# Patient Record
Sex: Male | Born: 1965 | Race: White | Hispanic: No | Marital: Married | Smoking: Never smoker
Health system: Southern US, Academic
[De-identification: ages and names within clinical notes are randomized; demographics above are authoritative.]

---

## 2000-01-04 ENCOUNTER — Other Ambulatory Visit (HOSPITAL_COMMUNITY): Payer: Self-pay

## 2022-05-07 IMAGING — MR MRI SHOULDER RT W/O CONTRAST
5 of 6 series · 30 of 40 positions shown · IV contrast (gadolinium)
Comparison: None available.

﻿EXAM:  14884   MRI SHOULDER RT W/O CONTRAST
INDICATION: 55-year-old with persistent, severe pain for several months with diminished range of motion.  No specific injury. No history of shoulder surgery in the past.
TECHNIQUE: Multiplanar, multisequential MRI of the right shoulder was performed without gadolinium contrast.

[Series 7: T2 fat-sat · axial · right · 4.0mm · 0.42mm/px · z∈[-16,+90]mm · 7 of 24 slices shown]
[im 1/24]
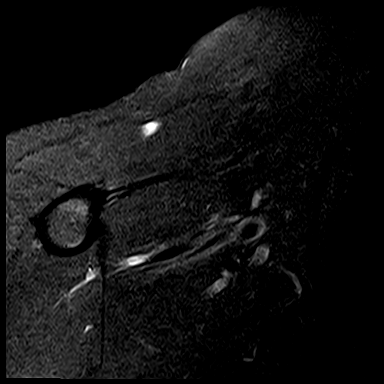
[im 4/24]
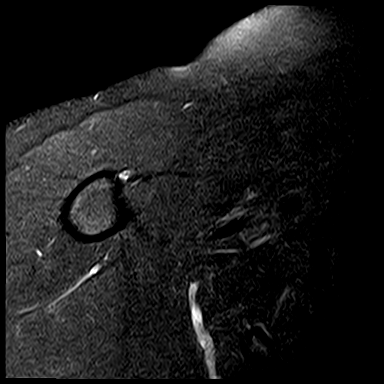
[im 8/24]
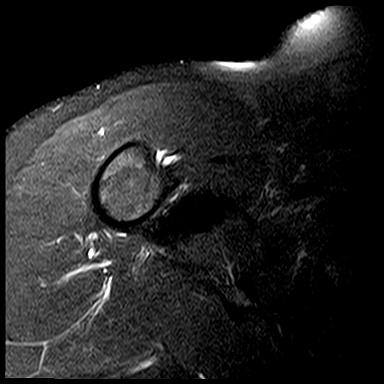
[im 12/24]
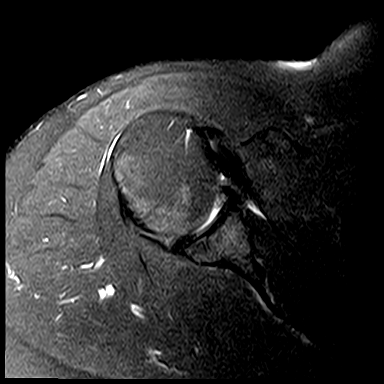
[im 16/24]
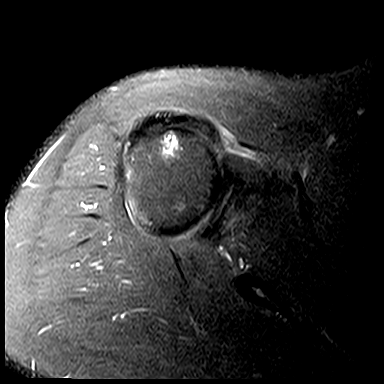
[im 20/24]
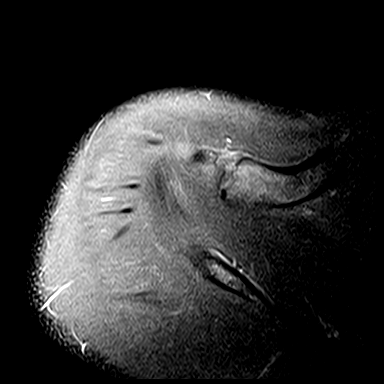
[im 24/24]
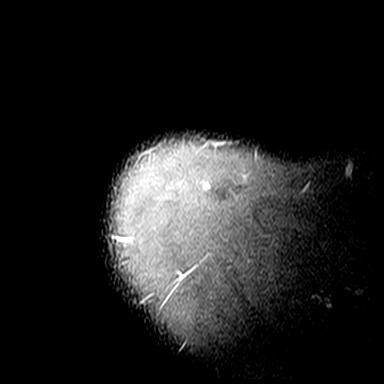

[Series 8: T1 · oblique · right · 4.0mm · 0.31mm/px · 6 of 22 slices shown]
[im 1/22]
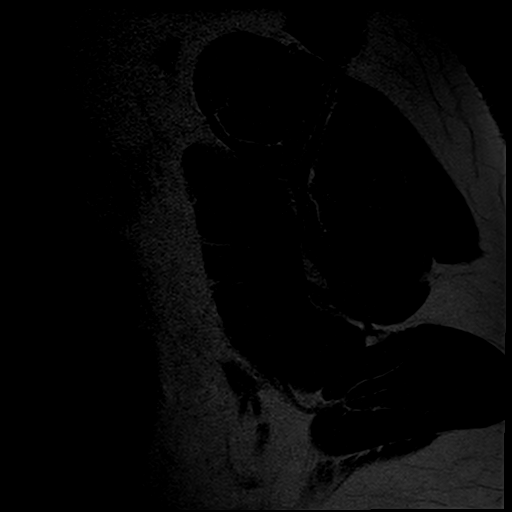
[im 5/22]
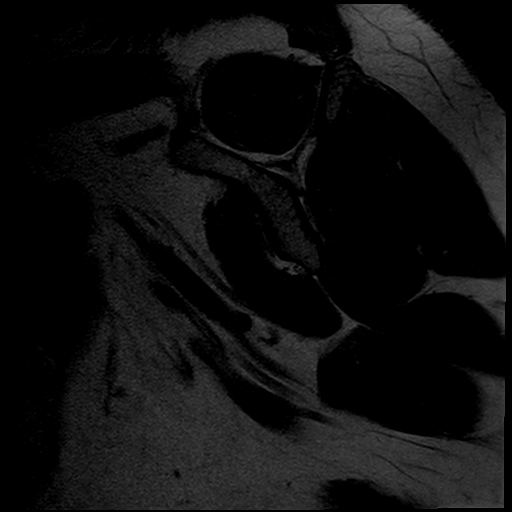
[im 9/22]
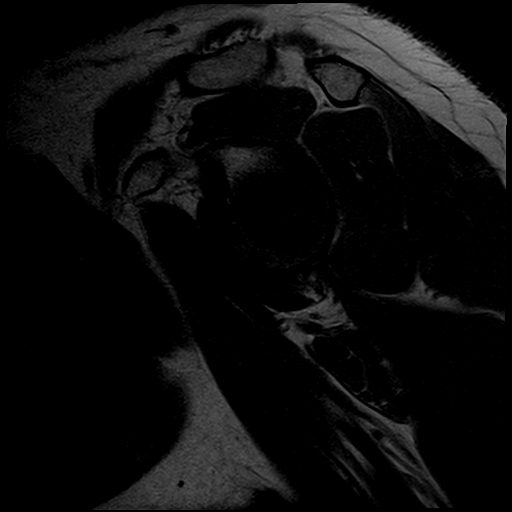
[im 13/22]
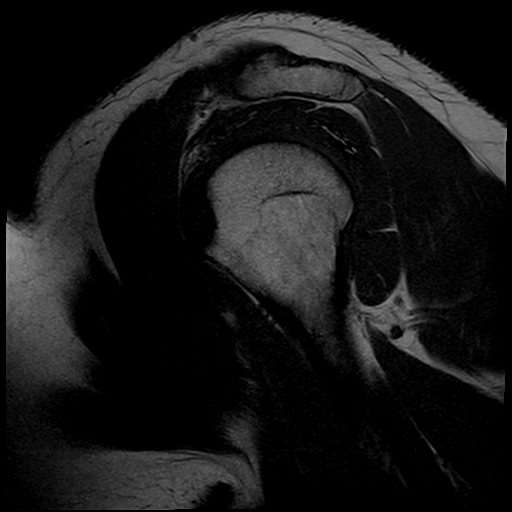
[im 17/22]
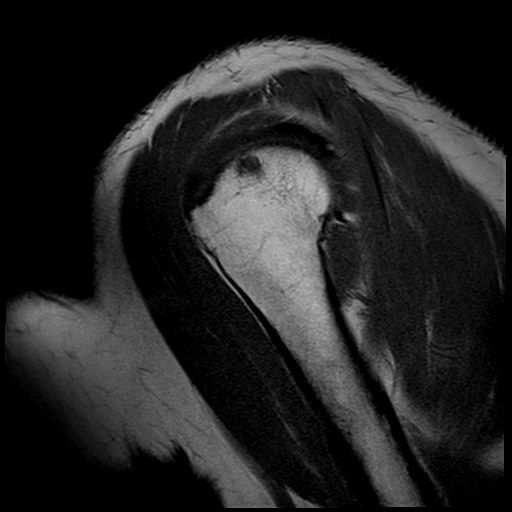
[im 22/22]
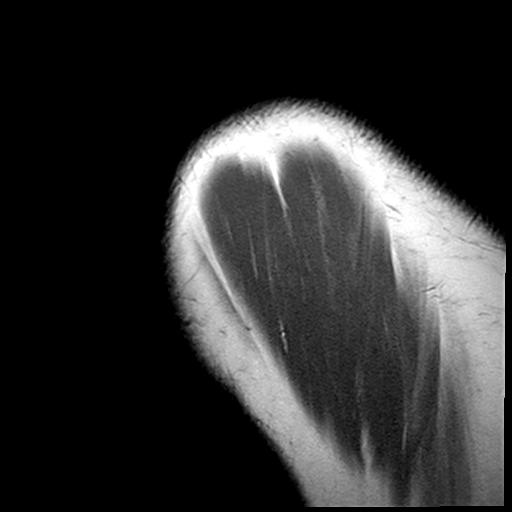

[Series 9: PD fat-sat · axial · right · 4.0mm · 0.36mm/px · z∈[-16,+90]mm · 7 of 24 slices shown (1 of 2)]
[im 1/24]
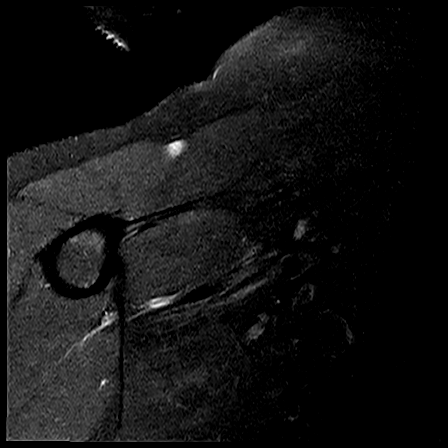
[im 4/24]
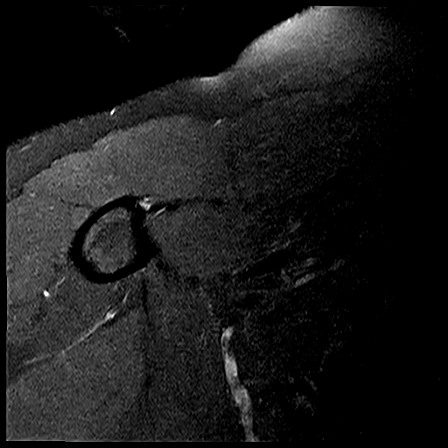
[im 8/24]
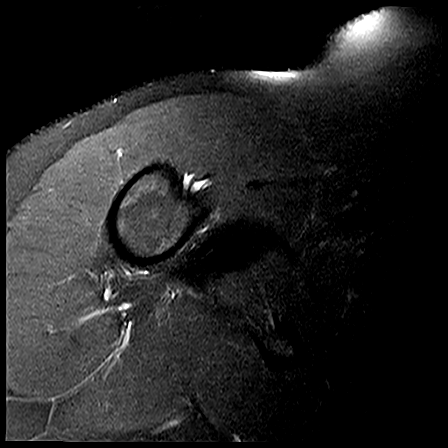
[im 12/24]
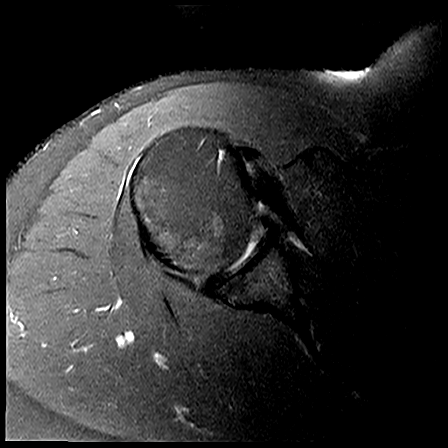
[im 16/24]
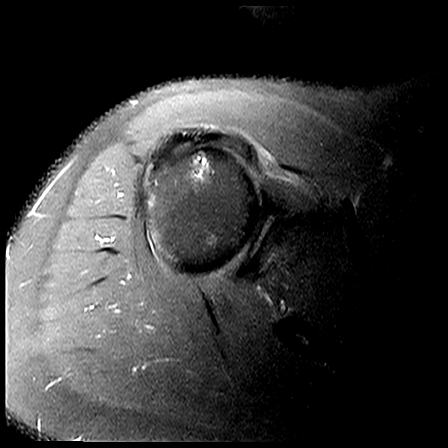
[im 20/24]
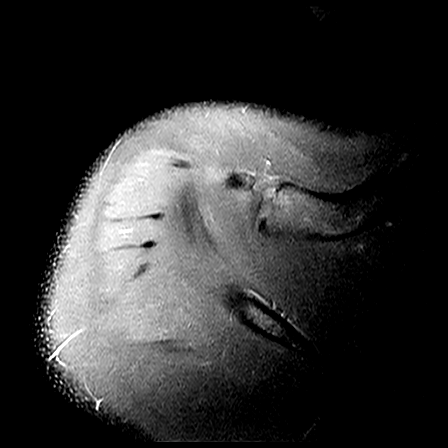
[im 24/24]
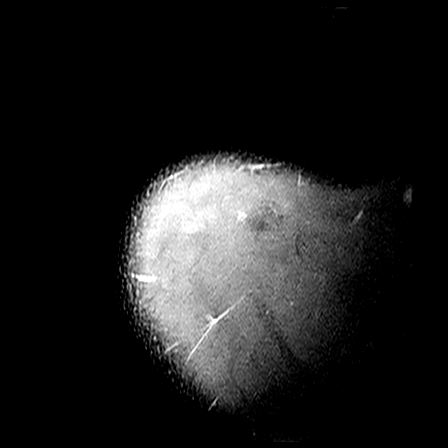

[Series 11: PD fat-sat · oblique · right · 4.0mm · 0.47mm/px · 7 of 23 slices shown (2 of 2)]
[im 1/23]
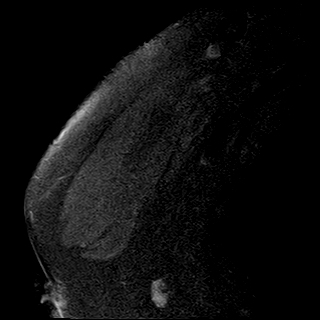
[im 4/23]
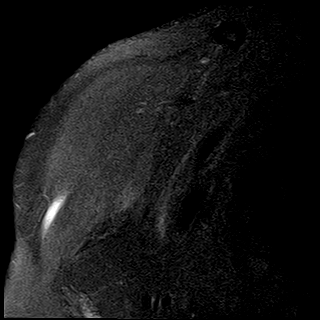
[im 8/23]
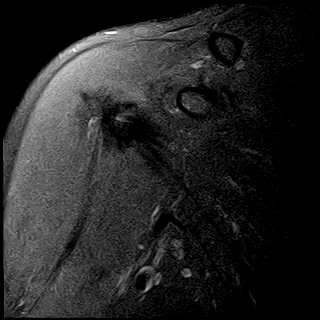
[im 12/23]
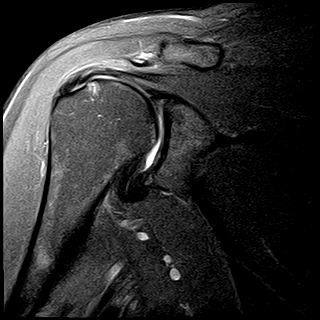
[im 15/23]
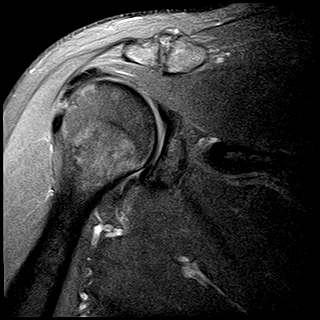
[im 19/23]
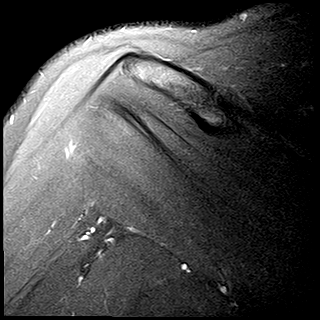
[im 23/23]
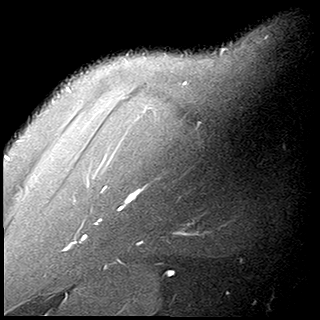

[Series 13: STIR · oblique · right · 4.0mm · 0.47mm/px · 3 of 23 slices shown]
[im 1/23]
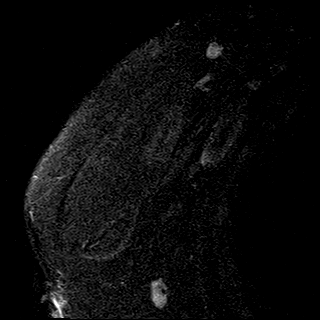
[im 4/23]
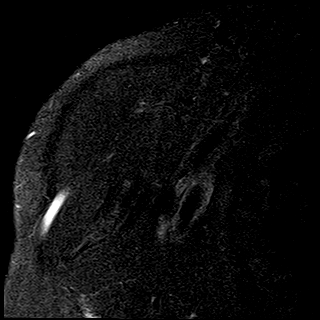
[im 8/23]
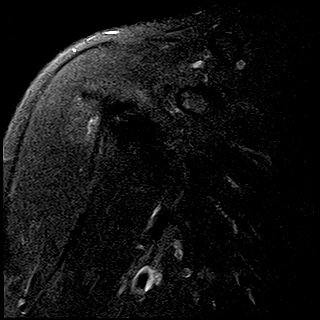

[30 of 40 positions shown; findings below may reference images not displayed]

FINDINGS: No acute bony lesions are seen at the right shoulder.  Degenerative arthritic changes of AC joint are mildly impinging on the subacromion space and the supraspinatus tendon.

Tiny fluid collection in the central portion of supraspinatus tendon suggests tiny intrasubstance partial-thickness tear.  No evidence of full-thickness rotator cuff tear is seen.  A 7 mm sized ganglion cyst is noted in the proximal humerus at the attachment of the supraspinatus tendon.

Mild inflammatory changes of the subscapularis tendon.

Glenoid labrum and long head of biceps tendon are intact.
IMPRESSION: 1. No acute bone changes at the right shoulder. 

2. No evidence of full-thickness rotator cuff tear.  Intrasubstance small partial thickness tear within the supraspinatus tendon. Mild tendinopathy of the subscapularis tendon.

3. Degenerative changes of the AC joint are mildly impinging on the subacromion space and the supraspinatus.  

4. No evidence of labral tear.

## 2022-05-21 ENCOUNTER — Other Ambulatory Visit: Payer: Self-pay

## 2022-06-05 ENCOUNTER — Encounter (HOSPITAL_COMMUNITY): Payer: Self-pay

## 2022-06-05 ENCOUNTER — Ambulatory Visit (HOSPITAL_COMMUNITY): Payer: 59

## 2022-07-02 ENCOUNTER — Other Ambulatory Visit: Payer: Self-pay

## 2022-07-09 ENCOUNTER — Other Ambulatory Visit: Payer: Self-pay

## 2022-07-09 ENCOUNTER — Ambulatory Visit (HOSPITAL_COMMUNITY)
Admission: RE | Admit: 2022-07-09 | Discharge: 2022-07-09 | Disposition: A | Discharge status: Still In Hospital | Payer: 59 | Source: Ambulatory Visit

## 2022-07-09 DIAGNOSIS — M5412 Radiculopathy, cervical region: Secondary | ICD-10-CM | POA: Insufficient documentation

## 2022-07-09 NOTE — PT Evaluation (Signed)
[facility]  Outpatient Physical Therapy  [place], [address]  [phone]  [phone]      Physical Therapy Cervical Evaluation    Date: 07/09/2022  Patient's Name: Jerry Quinn  Date of Birth: July 19, 1966    PT diagnosis/Reason for Referral: cervical pain/stiffnes with C6 RUE radiculopathy, right hand is numb all the time       SUBJECTIVE  Date of onset: 6-8 months gradually got worse over time    Mechanism of injury: NKI     Previous episodes/treatments: none     Medications for this problem: muscle relaxer and anti-inflammatory Ibuprofen as needed regulary 1200 mg per day     Diagnostic tests: none on neck, had MRI on right shoulder negative per ortho     Patient goals:  return feeling in right hand    Occupation:  postmaster    PMH: hypothyroid, right knee surgery meniscus, right middle finger, right foot plantar fascia surgery, left handed     Next MD visit: in Oct     Pain location: runs across post right shoulder, anterior upper chest/sh, some pain in right post elbow, tingling right forearm, Digits 1&2 are numb                     Pain description: DULL and throb, sharp if coughs/sneezes     Pain frequency:  CONTINUOUS and if he doesn't take the ibuprofen    Pain rating: Now 6-7   Best 3   Worst 8     Radiculopathy: yes C6 radicular pain     Pain increases with: SIT and with certain head positions            decreases with : MEDICATION and POSITION CHANGE    Sensation: decreased right radial hand, digits 1&2    Weakness: hand yes     Sleep affected: used to, medication helps, has to reposition frequently     Headaches: no     Dizziness: no     PLOF: independent     Subjective Functional Reports:    Sitting: LIMITED and 30 mins    Standing: WFL    Walking: WFL    Lifting: LIMITED and difficulty with grasp to do the lift     Patient-Specific Functional Score:    Problem Score   1. Fine manipulation at work 5   2. Typing  7   Total:  6           OBJECTIVE    ROM   right left   rotation 75  65   sidebend 40  40    flexion 45  -------------------------   extension 50 pain    -------------------------   AROM Bilateral shoulders WFL all planes      Strength      right left   Shrug (C3-4) 4 4   Shoulder abduction (C5) 5 5   Elbow flexion (C6) 5 5   Elbow extension (C7) 5 5   Wrist extension (C6) 5 5   Wrist flexion (C7) 5 5   Thumb extension (C8) 5 5   Finger abduction (T1) 5 5   Grip Right 90-95#   Left 120#   (norms for age Right 95 and left 100)  Tip pinch right  18.5 # left 19#   (norms for age right 56 and left 19.5)   Lateral pinch right 18#  left 25#   (norms for age right  20 and left 21)    Palpation: increased tone right UT and left C5-T1 paraspinals   Increased Right hand numbness with palpation/jt mobs right or left C6-7 levels     Posture: DECREASED LORDOSIS, FORWARD HEAD, and left shoulder lower. BUE IR, upper c/s ext      Reflexes    Biceps 1+ BUE    Triceps 1+ BUE    Brachioradialis 1+ BUE      Special tests compression negative     Treatment provided:REVIEW OF POC AND GOALS WITH PATIENT, ALL QUESTIONS ANSWERED, PATIENT EDUCATION, and posture education regarding phone/reading/computer work       ASSESSMENT    Impression: patient presents with cervical pain right with right C6 radicular symptoms in nondominant arm with limited AROM left rot and ext, pain with ext, mild fine motor difficulties associated with hand numbness resulting in dropping things a lot. Patient will benefit from PT services to improve ROM/posture, and decrease radicular symptoms    Rehab potential: GOOD    Short Term Goals: 2 Weeks  -Independent home program. ROM / Stabilization / Self care / Activity modification.    -Decrease in pain. Intermittent vs. constant. Worst SPS rating less than 6.   -Increase ROM c/s by 5 degrees bilateral rotation   -Improve function. Reduce radicular symptoms by 50 .   -Other compliant with HEP .      Long Term Goals: 4  Weeks   -Abolish  radicular symptoms. Worst SPS rating less than 2.  -Increase ROM to [facility] to allow normal body mechanics with all mobility.  -Tolerance of static posture not limited by pain.   -Patient specific functional score > 8             PLAN  Patient will attend 2 times per week x 4 weeks. Therapy may include, but is not limited to THERAPEUTIC EXERCISES, MYOFASCIAL/JOINT MOBILIZATION, POSTURE/BODY MECHANICS, HOME INSTRUCTIONS, HEAT/COLD, ULTRASOUND, ELECTRICAL STIMULATION, KINESIOTAPE, and MECHANICAL TRACTION    Plan for next visit MFR, jt mobs, traction, nerve glides, posture education, modalities as needed        Evaluation complexity:   Personal factors impacting POC: FREQUENT OR CHRONIC PAIN and OCCUPATIONAL ADLS (IE HEAVY LIFTING, REPETITIVE TASKS, LONG HOURS)   Co-morbidities impacting POC: PREVIOUS SURGERIES  Complexity of physical exam: INCLUDING MUSCULOSKELETAL SYSTEM (POSTURE, ROM, STRENGTH, HEIGHT/WEIGHT)   Clinical Presentation: STABLE   Evaluation Complexity: LOW-HISTORY 0, EXAMINATION 1-2, STABLE PRESENTATION        Total Session Time 40        Intervention minutes: EVALUATION 40 minutes    Zenia Resides, PT  07/09/2022, 15:03        Start of Service: _________          Certification:    From:______  Through:_________    I certify the need for these services furnished under this plan of treatment and while under my care.    Referring Provider Signature: _______________     Date : _____________________

## 2022-07-12 ENCOUNTER — Other Ambulatory Visit: Payer: Self-pay

## 2022-07-12 ENCOUNTER — Ambulatory Visit (HOSPITAL_COMMUNITY)
Admission: RE | Admit: 2022-07-12 | Discharge: 2022-07-12 | Disposition: A | Discharge status: Still In Hospital | Payer: 59 | Source: Ambulatory Visit

## 2022-07-12 NOTE — PT Treatment (Addendum)
[facility]  Outpatient Physical Therapy  [place], [address]  [phone]  [phone]    Physical Therapy Treatment Note    Date: 07/12/2022  Patient's Name: Jerry Quinn  Date of Birth: 1966-01-31            Visit #/POC:2 of 8  Authorization:  POC Signed?: No  POC Ends:   Next Progress Note Due: 9/19      Evaluating Physical Therapist: Zenia Resides, PT  PT diagnosis/Reason for Referral: cervical pain/stiffnes with C6 RUE radiculopathy, right hand is numb all the time    Next Scheduled Physician Appointment: After PT complete  Allergies/Contraindications:           Subjective: States that he continues to have numbness from his (R) elbow dow to his thumb and index fingers. States the numbness in his forearm comes and goes, however, the thumb and index numbness never goes away. State she does have a hard time griping objects with his (R) hand. States he has no pain in his neck.    Objective:Treatment delivered as follows:      EXERCISE/ACTIVITY NAME REPETITIONS RESISTANCE COMPLETED THIS DOS     MFR cervical paraspinals and UT   y   Joint mobs PA cervical   y   Cervical traction   y   Horizontal abd  x15 Red TB y   Cervical retraction standing x5  y                                 Assessment: Noted modification (minimizing movement with towels)required with cervical retraction d/t radicular symptoms recreated. Noted cervical traction in flexion and neutral recreated radicular symptoms. The radicular symptoms did slightly decrease with minimal pull and in about 10 degrees of cervical flexion.    Short Term Goals: 2 Weeks  -Independent home program. ROM / Stabilization / Self care / Activity modification.                            -Decrease in pain. Intermittent vs. constant. Worst SPS rating less than 6.               -Increase ROM c/s by 5 degrees bilateral rotation   -Improve function. Reduce radicular symptoms by 50 .               -Other  compliant with HEP .        Long Term Goals: 4  Weeks               -Abolish radicular symptoms. Worst SPS rating less than 2.  -Increase ROM to [facility] to allow normal body mechanics with all mobility.  -Tolerance of static posture not limited by pain.               -Patient specific functional score > 8      Plan: Continue following current POC. Assess patient's response to treatment. Inititae nerve glides next visit    Total Session Time 30 and Timed code minutes 30  THERAPEUTIC EXERCISE 10 minutes and JOINT MOBILIZATION/MFR 20 minutes      Carlton Adam, PTA  07/12/2022, 13:08

## 2022-07-15 ENCOUNTER — Other Ambulatory Visit: Payer: Self-pay

## 2022-07-15 ENCOUNTER — Ambulatory Visit (HOSPITAL_COMMUNITY)
Admission: RE | Admit: 2022-07-15 | Discharge: 2022-07-15 | Disposition: A | Discharge status: Still In Hospital | Payer: 59 | Source: Ambulatory Visit

## 2022-07-15 NOTE — PT Treatment (Signed)
[facility]  Outpatient Physical Therapy  [place], [address]  [phone]  [phone]    Physical Therapy Treatment Note    Date: 07/15/2022  Patient's Name: Jerry Quinn  Date of Birth: 28-Jul-1966            Visit #/POC:3 of 8  Authorization:  POC Signed?: No  POC Ends:   Next Progress Note Due: 9/19      Evaluating Physical Therapist: Zenia Resides, PT  PT diagnosis/Reason for Referral: cervical pain/stiffnes with C6 RUE radiculopathy, right hand is numb all the time    Next Scheduled Physician Appointment: After PT complete  Allergies/Contraindications:         Subjective: Reports that his pain today is about a 2/10. States that he did not have to take any ibuprofen over the weekend. Really didn't get sore from Friday's treatment.     Objective:Treatment delivered as follows: Pt was given supine cervical retraction for HEP with illustrated handout.      EXERCISE/ACTIVITY NAME REPETITIONS RESISTANCE COMPLETED THIS DOS     MFR cervical paraspinals and UT   y   Joint mobs PA cervical   y   Cervical traction   y   Horizontal abd  x15 Red TB y   Cervical retraction standing x5  y                                 Assessment: No modifications needed today. He responded favorably to last treatment with decreased use of medications. Reported some soreness after session but increase in radicular symptoms.    Short Term Goals: 2 Weeks  -Independent home program. ROM / Stabilization / Self care / Activity modification.                            -Decrease in pain. Intermittent vs. constant. Worst SPS rating less than 6.               -Increase ROM c/s by 5 degrees bilateral rotation   -Improve function. Reduce radicular symptoms by 50 .               -Other compliant with HEP .        Long Term Goals: 4  Weeks               -Abolish radicular symptoms. Worst SPS rating less than 2.  -Increase ROM to [facility] to allow normal body mechanics with all  mobility.  -Tolerance of static posture not limited by pain.               -Patient specific functional score > 8      Plan: Continue following current POC. Assess patient's response to treatment.     Total Session Time 30 and Timed code minutes 30  THERAPEUTIC EXERCISE 10 minutes and JOINT MOBILIZATION/MFR 20 minutes      Porshia Blizzard, PTA 07/15/2022 17:23

## 2022-07-19 ENCOUNTER — Ambulatory Visit (HOSPITAL_COMMUNITY)
Admission: RE | Admit: 2022-07-19 | Discharge: 2022-07-19 | Disposition: A | Discharge status: Still In Hospital | Payer: 59 | Source: Ambulatory Visit

## 2022-07-19 ENCOUNTER — Other Ambulatory Visit: Payer: Self-pay

## 2022-07-19 NOTE — PT Treatment (Signed)
[facility]  Outpatient Physical Therapy  [place], [address]  [phone]  [phone]    Physical Therapy Treatment Note    Date: 07/19/2022  Patient's Name: Jerry Quinn  Date of Birth: [birth year]         Visit #/POC:4of 8  Authorization:  POC Signed?: No  POC Ends:   Next Progress Note Due: 9/19        Evaluating Physical Therapist: Zenia Resides, PT  PT diagnosis/Reason for Referral: cervical pain/stiffnes with C6 RUE radiculopathy, right hand is numb all the time    Next Scheduled Physician Appointment: After PT complete  Allergies/Contraindications:            Subjective: Reports no pain in his shoulders now. States he can move his neck a little bit better but with less pain.     Objective:Treatment delivered as follows: Pt was given supine cervical retraction for HEP with illustrated handout.        EXERCISE/ACTIVITY NAME REPETITIONS RESISTANCE COMPLETED THIS DOS      MFR cervical paraspinals and UT     y   Joint mobs PA cervical     y   Cervical traction     y   Horizontal abd  x15 Red TB y   Cervical retraction standing x5   y    UBE fwd/backwards alt 1 min 5 min  60 rpm  y                                          Assessment: Decreased radicular symptoms noted. Tolerated addition of UBE. Progressing well towards goals.      Short Term Goals: 2 Weeks  -Independent home program. ROM / Stabilization / Self care / Activity modification.                            -Decrease in pain. Intermittent vs. constant. Worst SPS rating less than 6.               -Increase ROM c/s by 5 degrees bilateral rotation   -Improve function. Reduce radicular symptoms by 50 .               -Other compliant with HEP .        Long Term Goals: 4  Weeks               -Abolish radicular symptoms. Worst SPS rating less than 2.  -Increase ROM to [facility] to allow normal body mechanics with all mobility.  -Tolerance of static posture not limited by pain.               -Patient  specific functional score > 8      Plan: Continue following current POC. Progress as tolerated.       Total Session Time 30 and Timed code minutes 30  THERAPEUTIC EXERCISE 20 minutes and JOINT MOBILIZATION/MFR 10 minutes      Carlton Adam, PTA  07/19/2022, 17:03

## 2022-07-22 ENCOUNTER — Other Ambulatory Visit: Payer: Self-pay

## 2022-07-22 ENCOUNTER — Ambulatory Visit (HOSPITAL_COMMUNITY)
Admission: RE | Admit: 2022-07-22 | Discharge: 2022-07-22 | Disposition: A | Discharge status: Still In Hospital | Payer: 59 | Source: Ambulatory Visit

## 2022-07-22 NOTE — PT Treatment (Signed)
[facility]  Outpatient Physical Therapy  [place], [address]  [phone]  [phone]    Physical Therapy Treatment Note    Date: 07/22/2022  Patient's Name: Jerry Quinn  Date of Birth: 1966/01/08        Visit #/POC:5of 8  Authorization:  POC Signed?: yes   POC Ends: 08/06/22   Next Progress Note Due: 9/19        Evaluating Physical Therapist: Zenia Resides, PT  PT diagnosis/Reason for Referral: cervical pain/stiffness with C6 RUE radiculopathy, right hand is numb all the time    Next Scheduled Physician Appointment: After PT complete  Allergies/Contraindications: none           Patient was 15 mins late d/t traffic     Subjective: pain level 1-2. Less stiff now.  Pain range past wk 1-4. Worse 1st thing in am. Sleeps with wedge d/t snoring and 1 pillow. Sleeps sidelying. No change in hand numbness - occurs continuously     Objective: AROM c/s Rrot 80, Lrot 80 stiff, RSB 40, LSB 40 stiff, flex 50, ext 50 pain CT junction   MFR and therex per flow sheet     EXERCISE/ACTIVITY NAME REPETITIONS RESISTANCE COMPLETED THIS DOS      MFR cervical paraspinals and UT     y   Joint mobs PA cervical     y   Cervical traction     y   Horizontal abd  X15 seated  Green  TB y   Cervical retraction standing x5   y    UBE fwd/backwards alt 1 min 5 min  60 rpm  y   Seated ER     green band  Y                            Assessment: Increased ROM noted and is now symmetrical. Pain at CT junction with end range extension. No change in Right hand/arm numbness. Her has met 2 of his STG's. Cues to maintain cervical retraction with sit/stand  Short Term Goals: 2 Weeks  -Independent home program. ROM / Stabilization / Self care / Activity modification.  Progressing 07/22/22               -Decrease in pain. Intermittent vs. constant. Worst SPS rating less than 6.   MET 07/22/22                -Increase ROM c/s by 5 degrees bilateral rotation   MET 07/22/22   -Improve function.  Reduce radicular symptoms by 50% .               -Other compliant with HEP .  Long Term Goals: 4  Weeks               -Abolish radicular symptoms. Worst SPS rating less than 2.  -Increase ROM to [facility] to allow normal body mechanics with all mobility.  -Tolerance of static posture not limited by pain.               -Patient specific functional score > 8      Plan: continue and progress as tolerated     Total Session Time 35 and Timed code minutes 35  THERAPEUTIC EXERCISE 15 minutes and JOINT MOBILIZATION/MFR 20 minutes      Zenia Resides, PT  07/22/2022, 16:18

## 2022-07-26 ENCOUNTER — Other Ambulatory Visit: Payer: Self-pay

## 2022-07-26 ENCOUNTER — Ambulatory Visit (HOSPITAL_COMMUNITY)
Admission: RE | Admit: 2022-07-26 | Discharge: 2022-07-26 | Disposition: A | Discharge status: Still In Hospital | Payer: 59 | Source: Ambulatory Visit

## 2022-07-26 NOTE — PT Treatment (Signed)
[facility]  Outpatient Physical Therapy  [place], [address]  [phone]  [phone]    Physical Therapy Treatment Note    Date: 07/26/2022  Patient's Name: Jerry Quinn  Date of Birth: 08-28-1966      Visit #/POC:6of 8  Authorization: no auth required  POC Signed?: yes   POC Ends: 08/06/22   Next Progress Note Due: 9/19        Evaluating Physical Therapist: Zenia Resides, PT  PT diagnosis/Reason for Referral: cervical pain/stiffness with C6 RUE radiculopathy, right hand is numb all the time    Next Scheduled Physician Appointment: 10/22  Allergies/Contraindications: none              Subjective: Reports his neck has improved and reports only stiffness in the mornings. States that he has not taken an Ibuprofen about 2 weeks or so.  States no improvement with  the numbness in his (R) thumb and index fingers and reports it  is continuous.     Objective: Treatment delivered as follows:    AROM:  c/s Rrot 80, Lrot 80 stiff, RSB 40, LSB 40 , flex 53, ext 50    EXERCISE/ACTIVITY NAME REPETITIONS RESISTANCE COMPLETED THIS DOS      MFR cervical paraspinals and UT     n   Joint mobs PA cervical     n   Cervical traction     n   Horizontal abd  X15 seated  [place]  TB Y(HEP) 10/6   Cervical retraction standing x5   n    UBE fwd/backwards alt 1 min 5 min  60 rpm  y   Seated ER   x15 seated  green band  Y     Row machine  2x10  50#  y    open book standing  x10    y   Seated thoracic flexion with rotation arms crossed x5  Y(HEP) 10/6   HEP Review/update   y   Standing median nerve glide   y       Access Code: [email or web address]  URL: [email or web address]  Date: 07/26/2022  Prepared by: Carlton Adam    Exercises  - Seated Thoracic Flexion and Rotation with Arms Crossed  - 1 x daily - 7 x weekly - 3 sets - 10 reps  - Seated Shoulder Horizontal Abduction with Resistance  - 1 x daily - 7 x weekly - 3 sets - 10 reps    Assessment: Noted improvemnts in mobility and  strength. No improvement with numbness in 1st and 2nd digit. Tolerated median nerve glide well. Reports when turning (L) open book did have tingling in forearm. Patient was able to verbalize and demonstrate proper technique with HEP.      Short Term Goals: 2 Weeks  -Independent home program. ROM / Stabilization / Self care / Activity modification.  Progressing 07/22/22               -Decrease in pain. Intermittent vs. constant. Worst SPS rating less than 6.   MET 07/22/22                -Increase ROM c/s by 5 degrees bilateral rotation   MET 07/22/22   -Improve function. Reduce radicular symptoms by 50% . NOT MET 10/6               -Other compliant with HEP .  Long Term Goals: 4  Weeks               -  Abolish radicular symptoms. Worst SPS rating less than 2.  -Increase ROM to [facility] to allow normal body mechanics with all mobility.  -Tolerance of static posture not limited by pain.               -Patient specific functional score > 8      Plan: Continue and progress therex as tolerated focusing on strengthening and mobility. 2 visits left on current POC.        Total Session Time 35 and Timed code minutes 35  THERAPEUTIC EXERCISE 35 minutes      Carlton Adam, PTA  07/26/2022, 15:31

## 2022-07-30 ENCOUNTER — Other Ambulatory Visit: Payer: Self-pay

## 2022-07-30 ENCOUNTER — Ambulatory Visit (HOSPITAL_COMMUNITY)
Admission: RE | Admit: 2022-07-30 | Discharge: 2022-07-30 | Disposition: A | Discharge status: Still In Hospital | Payer: 59 | Source: Ambulatory Visit

## 2022-07-30 ENCOUNTER — Other Ambulatory Visit (HOSPITAL_COMMUNITY): Payer: Self-pay | Admitting: Orthopaedic Surgery

## 2022-07-30 DIAGNOSIS — M5412 Radiculopathy, cervical region: Secondary | ICD-10-CM

## 2022-07-30 NOTE — PT Treatment (Signed)
[facility]  Outpatient Physical Therapy  [place], [address]  [phone]  [phone]    Physical Therapy Treatment Note    Date: 07/30/2022  Patient's Name: Jerry Quinn  Date of Birth: Sep 04, 1966      Visit #/POC:7 of 8  Authorization: no auth required  POC Signed?: yes   POC Ends: 08/06/22   Next Progress Note Due: 9/19        Evaluating Physical Therapist: Zenia Resides, PT  PT diagnosis/Reason for Referral: cervical pain/stiffness with C6 RUE radiculopathy, right hand is numb all the time    Next Scheduled Physician Appointment: 10/23  Allergies/Contraindications: none               Subjective: Reports his neck is feeling good. States that he continues to have the numbness in his thumb and index finger.     Objective: Treatment delivered as follows:     AROM:  c/s Rrot 80, Lrot 80 , RSB 40, LSB 40 , flex 53, ext 50      EXERCISE/ACTIVITY NAME REPETITIONS RESISTANCE COMPLETED THIS DOS      MFR cervical paraspinals and UT     n   Joint mobs PA cervical     n   Cervical traction     n   Horizontal abd  X15 seated  Green  TB Y(HEP) 10/6   Cervical retraction standing x5   n    UBE fwd/backwards alt 1 min 5 min  60 rpm  y   Seated ER   x15 seated  green band  Y     Row machine  2x10  50#  y    open book standing  x10    y   Seated thoracic flexion with rotation arms crossed x5   n(HEP) 10/6   HEP Review/update     n   Standing median nerve glide     y   Reassessment of goals   y      Patient-Specific Functional Score:                                                                                               10/10  Problem Score   1. Fine manipulation at work 5                       5   2. Typing  7                       8   Total:  6                       7     Assessment:  Patient has made excellent progress towards goals. Noted the numbness in 1st and 2nd digit tends to be patient's main barrier/limitation. Noted slight improved  patient-specific functional score from 6 to 7.        Short Term Goals: 2 Weeks  -Independent home program. ROM / Stabilization / Self care /  Activity modification.  MET 10/10               -Decrease in pain. Intermittent vs. constant. Worst SPS rating less than 6.   MET 07/22/22                -Increase ROM c/s by 5 degrees bilateral rotation   MET 07/22/22   -Improve function. Reduce radicular symptoms by 50% . NOT MET 10/6               -Other compliant with HEP . MET 10/10  Long Term Goals: 4  Weeks               -Abolish radicular symptoms. Worst SPS rating less than 2. Partially MET 10/10  -Increase ROM to [facility] to allow normal body mechanics with all mobility. MET 10/10  -Tolerance of static posture not limited by pain. MET 10/10               -Patient specific functional score > 8 NOT MET 101/10     Plan: Continue and progress therex as tolerated focusing on strengthening and mobility. D/C next visit. Update HEP.               Total Session Time 30 and Timed code minutes 30  THERAPEUTIC EXERCISE 30 minutes      Carlton Adam, PTA  07/30/2022, 17:13

## 2022-08-01 ENCOUNTER — Ambulatory Visit
Admission: RE | Admit: 2022-08-01 | Discharge: 2022-08-01 | Disposition: A | Discharge status: Still In Hospital | Payer: 59 | Source: Ambulatory Visit | Attending: Orthopaedic Surgery | Admitting: Orthopaedic Surgery

## 2022-08-01 ENCOUNTER — Other Ambulatory Visit: Payer: Self-pay

## 2022-08-01 NOTE — PT Treatment (Signed)
[facility]  Outpatient Physical Therapy  [place], [address]  [phone]  [phone]    Physical Therapy Treatment Note    Date: 08/01/2022  Patient's Name: Jerry Quinn  Date of Birth: Feb 19, 1966    PHYSICAL THERAPY DISCHARGE NOTE  Patient attended 8 sessions of PT for c/s pain/stiffness with C6 RUE radiculopathy from 8/19 to 9/12. Treatment included MFR, jt mobs, therex with HEP. Patient's ROM is WFL, symmetrical with only end range pain with ext. The only symptom he has now is the continued right hand numbness which PT treatment did not make any improvement on. Patient wants an MRI. Patient is being discharged from PT services at this time. All goals were met except for the numbness.         Visit #/POC:8 of 8  Authorization: no auth required  POC Signed?: yes   POC Ends: 08/06/22   Next Progress Note Due: 9/19        Evaluating Physical Therapist: Zenia Resides, PT  PT diagnosis/Reason for Referral: cervical pain/stiffness with C6 RUE radiculopathy, right hand is numb all the time    Next Scheduled Physician Appointment: 10/23  Allergies/Contraindications: none               Subjective: Denies pain and reports has not had to take an Ibuprofen in 2 weeks plus. States that he continues to have the numbness in his thumb and index finger. States that he would like to get a MRI now to find out what is causing the numbness.     Objective: Treatment delivered as follows:     AROM:  c/s Rrot 80, Lrot 80 , RSB 40, LSB 40 , EXT and Flexion WFL pain at end range with extension.     EXERCISE/ACTIVITY NAME REPETITIONS RESISTANCE COMPLETED THIS DOS      MFR cervical paraspinals and UT     n   Joint mobs PA cervical     n   Cervical traction     n   Horizontal abd  X15 seated  Green  TB Y(HEP) 10/6   Cervical retraction standing x5   n    UBE fwd/backwards alt 1 min 5 min  60 rpm  y   Seated ER   x15 seated  green band  n    Row machine  2x10  50#  n     open book standing  x5    y   Seated thoracic flexion with rotation arms crossed x5   n(HEP) 10/6   HEP Review/update     Y   Standing median nerve glide     Y (HEP)   Reassessment of goals     y   Thera band and handouts given to pateint  Access Code: [email or web address]  URL: [email or web address]  Date: 08/01/2022  Prepared by: Carlton Adam    Exercises  - Median Nerve Flossing - Tray  - 1 x daily - 7 x weekly - 3 sets - 10 reps  - Standing Row with Resistance  - 1 x daily - 7 x weekly - 3 sets - 10 reps  - Standing Shoulder Single Arm PNF D2 Flexion with Resistance  - 1 x daily - 7 x weekly - 3 sets - 10 reps  - Standing Thoracic Open Book at [place]  - 1 x daily - 7 x weekly - 3 sets - 10 reps    Patient-Specific Functional Score:  10/10  Problem Score   1. Fine manipulation at work 5                       5   2. Typing  7                       8   Total:  6                       7      Assessment:  Patient has met all STGs and 2 of 4 LTGs.  Improvements in strngth and mobility noted. Numbness in 1st and 2nd digit tends to be patient's main barrier/limitation.        Short Term Goals: 2 Weeks  -Independent home program. ROM / Stabilization / Self care / Activity modification.  MET 10/10               -Decrease in pain. Intermittent vs. constant. Worst SPS rating less than 6.   MET 07/22/22                -Increase ROM c/s by 5 degrees bilateral rotation   MET 07/22/22   -Improve function. Reduce radicular symptoms by 50% . MET 10/12 Continues to have Numbness in(R) thumb and index               -Other compliant with HEP . MET 10/10  Long Term Goals: 4  Weeks               -Abolish radicular symptoms. Worst SPS rating less than 2. Partially Met  10/12 continues to have numbness in (R) thumb and index  -Increase ROM to [facility] to allow normal body mechanics with all mobility. MET 10/10  -Tolerance of static posture not limited by  pain. MET 10/10               -Patient specific functional score > 8 NOT MET 10/12     Plan: See PT note.          Total Session Time 20 and Timed code minutes 20  THERAPEUTIC EXERCISE 20 minutes      Carlton Adam, PTA  08/01/2022, 16:48

## 2022-08-30 ENCOUNTER — Other Ambulatory Visit (HOSPITAL_COMMUNITY): Payer: Self-pay

## 2022-08-30 IMAGING — MR MRI CERVICAL SPINE WITHOUT CONTRAST
4 of 5 series · 23 of 48 positions shown · IV contrast (gadolinium)
Comparison: MRI right shoulder dated 05/07/2022.

﻿EXAM:  [id number]   MRI CERVICAL SPINE WITHOUT CONTRAST
INDICATION: 55-year-old male with persistent neck pain with radicular symptoms to right shoulder and right arm.  Numbness in right hand.  Paresthesia at C6 territory.  No history of malignancy or C-spine surgery.
TECHNIQUE: Multiplanar, multisequential MRI of the C-spine was performed without gadolinium contrast.

[Series 5: T2 · sagittal · 3.0mm · 0.75mm/px · 8 of 13 slices shown (1 of 2)]
[im 1/13]
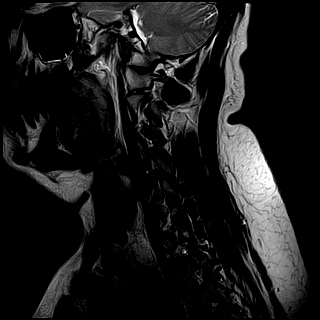
[im 2/13]
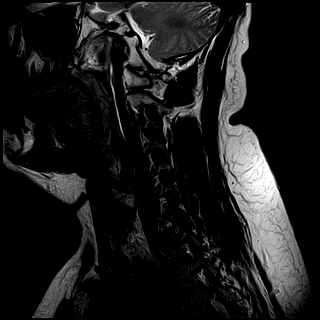
[im 4/13]
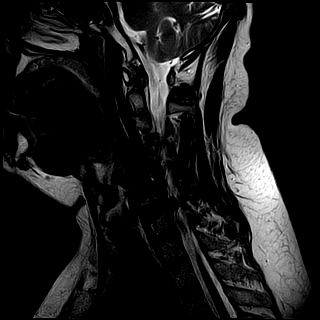
[im 6/13]
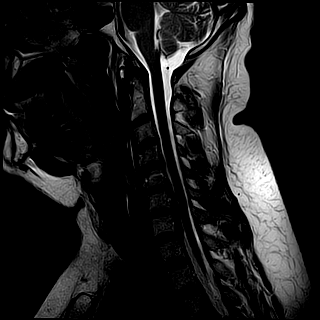
[im 7/13]
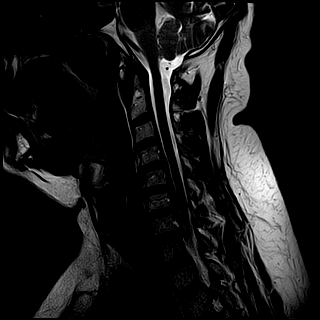
[im 9/13]
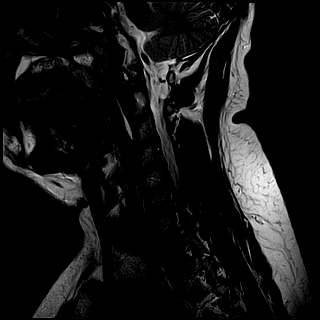
[im 11/13]
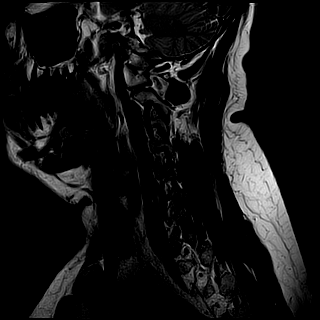
[im 13/13]
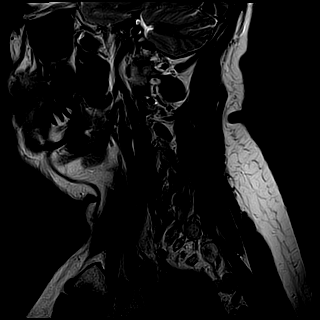

[Series 6: T1 · sagittal · 3.0mm · 0.47mm/px · 3 of 13 slices shown]
[im 2/13]
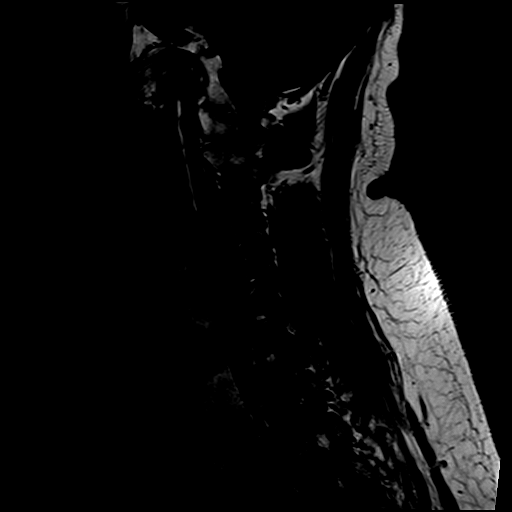
[im 7/13]
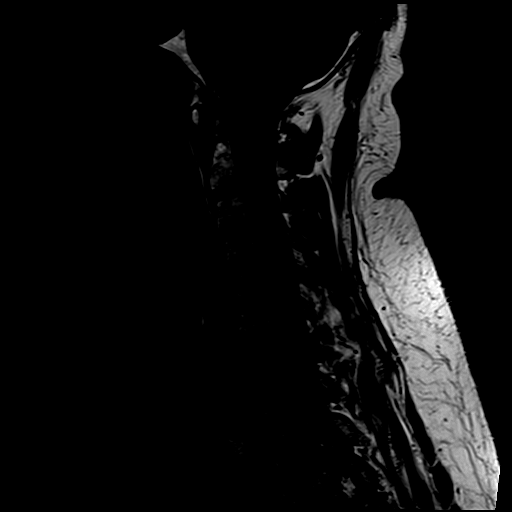
[im 11/13]
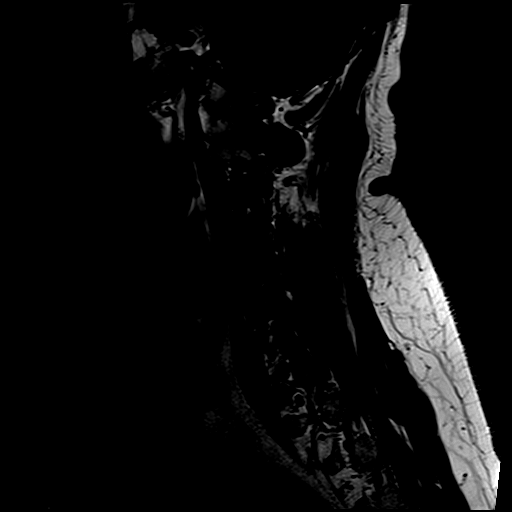

[Series 7: STIR · sagittal · 3.0mm · 0.47mm/px · 3 of 13 slices shown]
[im 2/13]
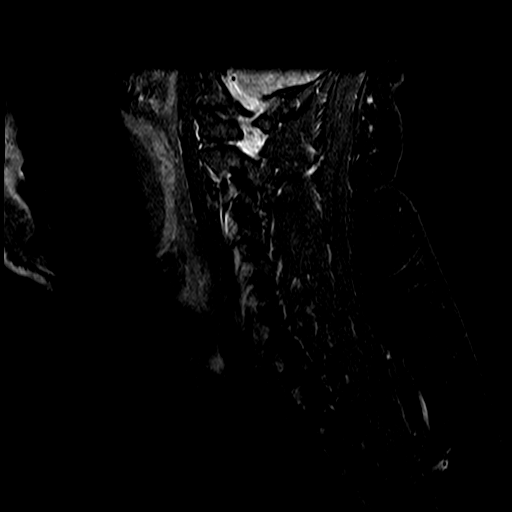
[im 7/13]
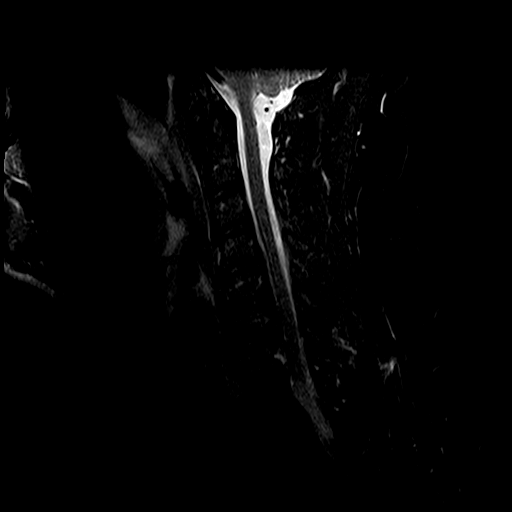
[im 11/13]
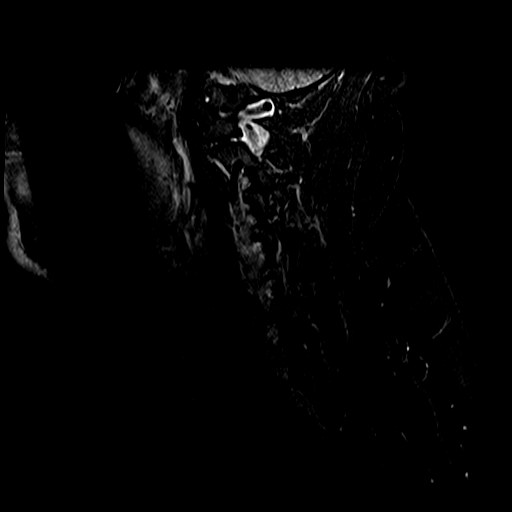

[Series 8: T2 · axial · 3.0mm · 0.39mm/px · z∈[-84,+8]mm · 9 of 18 slices shown (2 of 2)]
[im 1/18]
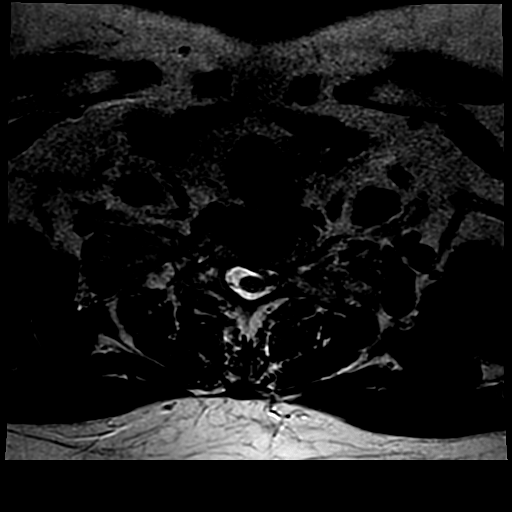
[im 4/18]
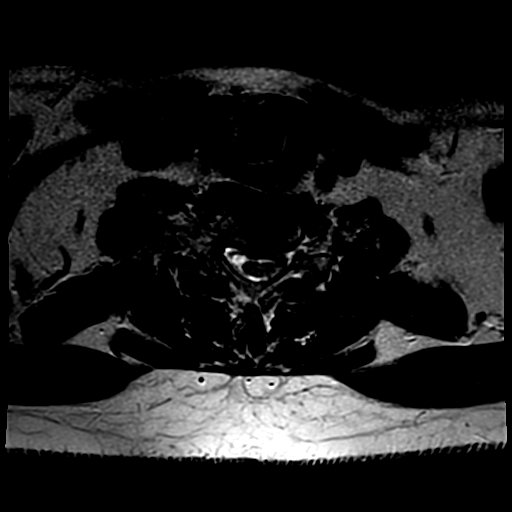
[im 5/18]
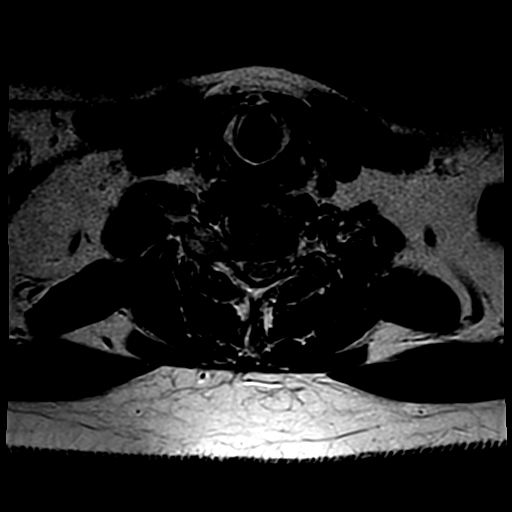
[im 8/18]
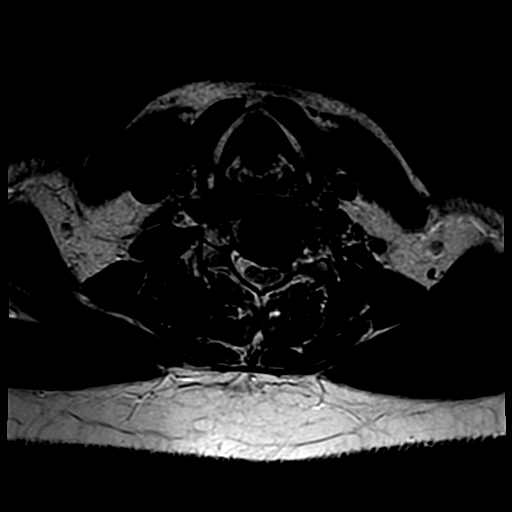
[im 10/18]
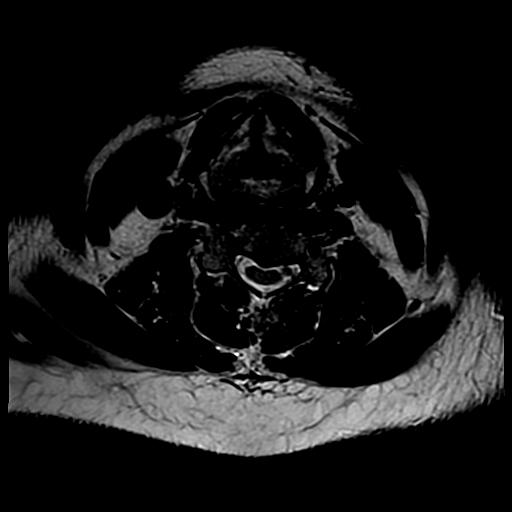
[im 13/18]
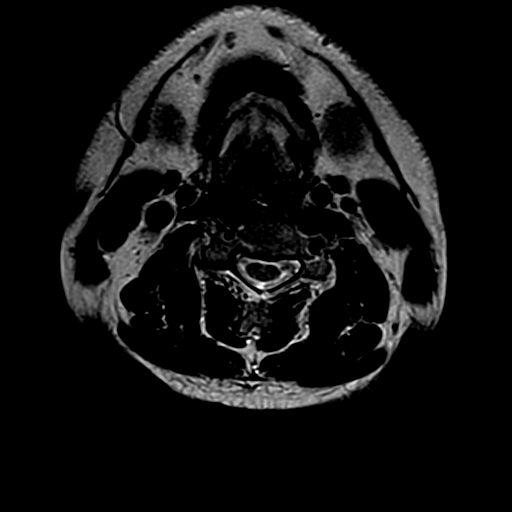
[im 14/18]
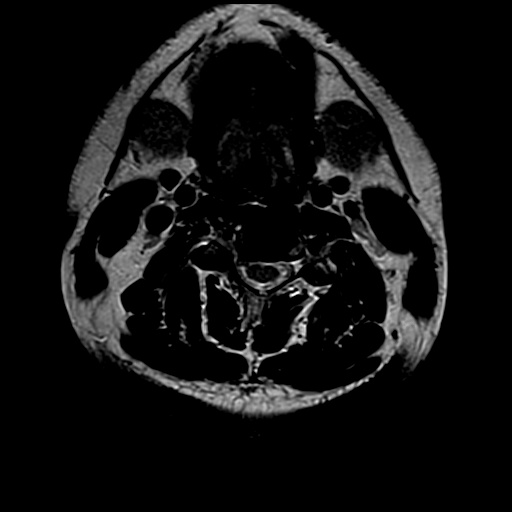
[im 16/18]
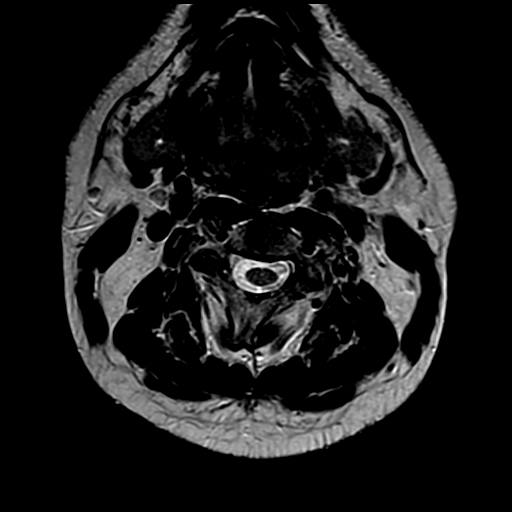
[im 18/18]
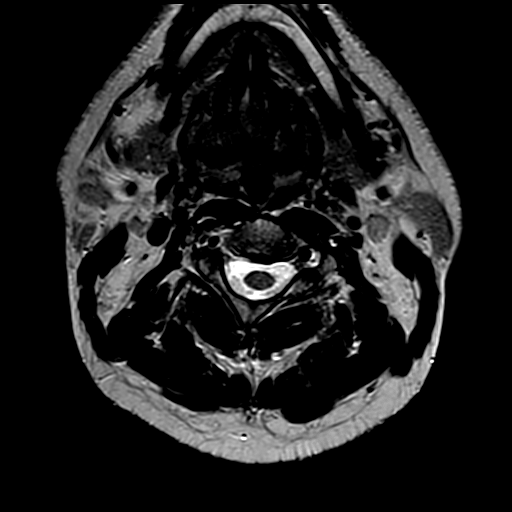

[23 of 48 positions shown; findings below may reference images not displayed]

FINDINGS: No acute bone changes of cervical vertebrae are seen.

No focal abnormalities of foramen magnum structures are seen in the sagittal projection. 

At C2-3 level, no focal disc lesions are seen. 

At C3-4 level, degenerative disc disease with asymmetric bulging annulus to the right causing moderate right foraminal narrowing.

At C4-5 level, small central disc bulge is minimally impinging on thecal sac in the midline.

At C5-6 level, tiny central disc bulge is noted without significant compromise of thecal sac.  Degenerative disc changes are causing mild right foraminal narrowing.

At C6-7 level, significant degenerative changes are seen with a large disc protrusion on the right side, measuring approximately 12 mm in transverse diameter causing severe compromise of the right lateral recess and right neural foramen at C6-7 level impinging on the right C7 nerve root as well as cervical spinal cord on the right side. Compromise of thecal sac is noted with AP diameter in the midline measuring 8.7 mm.

At C7-T1 level, smaller, broad-based disc protrusion on the left side extending from midline to the foraminal level measuring 9.5 mm in transverse diameter impinging on the left lateral recess and left neural foramen at this level.

Cervical spinal cord shows no focal abnormalities.
IMPRESSION: 1. No focal bone changes of cervical vertebrae. 

2. At C6-7 level, significant degenerative changes are seen with a large disc protrusion on the right side, measuring approximately 12 mm in transverse diameter causing severe compromise of the right lateral recess and right neural foramen at C6-7 level impinging on the right C7 nerve root as well as cervical spinal cord on the right side. Compromise of thecal sac is noted with AP diameter in the midline measuring 8.7 mm.

3. At C7-T1 level, smaller, broad-based disc protrusion on the left side extending from midline to the foraminal level measuring 9.5 mm in transverse diameter impinging on the left lateral recess and left neural foramen at this level. 

4. Findings at other disc levels are described above in detail.

5. No focal lesions of the cervical spinal cord.

## 2022-11-19 ENCOUNTER — Ambulatory Visit (INDEPENDENT_AMBULATORY_CARE_PROVIDER_SITE_OTHER): Payer: Self-pay | Admitting: Neurological Surgery

## 2022-12-31 ENCOUNTER — Ambulatory Visit (INDEPENDENT_AMBULATORY_CARE_PROVIDER_SITE_OTHER): Payer: 59 | Admitting: Neurological Surgery

## 2022-12-31 ENCOUNTER — Encounter (INDEPENDENT_AMBULATORY_CARE_PROVIDER_SITE_OTHER): Payer: Self-pay | Admitting: Neurological Surgery

## 2022-12-31 ENCOUNTER — Other Ambulatory Visit: Payer: Self-pay

## 2022-12-31 VITALS — BP 145/108 | HR 87 | Resp 16 | Ht 73.0 in | Wt 265.0 lb

## 2022-12-31 DIAGNOSIS — M5412 Radiculopathy, cervical region: Secondary | ICD-10-CM

## 2022-12-31 NOTE — H&P (Signed)
NEUROSURGERY, [facility]  [place] [address]    History & Physical    Name: Jerry Quinn MRN:  [id number]   Date: 12/31/2022 Age: 57 y.o.       Subjective:     Patient ID:   Jerry Quinn is an 57 y.o. male.      Chief Complaint:      Chief Complaint   Patient presents with    New Patient     New pt, neck pain w/ rt arm radicular symptoms. MRI @ [facility] radiology of the [facility]. PT brought disc.         The patient has a about a 1 year history of right upper extremity pain that initially began as severe pain into the right hand but has since that time decrease in severity and he does have some numbness that goes into his thumb and 1st finger.  He now has this numbness that persists but he has no weakness and he has no other radicular type of symptoms.  The patient does not describe any difficulty with ambulation or gait or any fine motor dysfunction.  The patient did do some physical therapy.  He does have an MRI that was done at [facility] for evaluation of disc herniations noted on that study.  At this time he is relatively asymptomatic in his functioning at a good rate except for some of the numbness in a couple of fingers of his right hand.        History reviewed. No pertinent past medical history.   History reviewed. No pertinent surgical history.   Social History     Tobacco Use    Smoking status: Never    Smokeless tobacco: Never   Substance Use Topics    Alcohol use: Not Currently    Drug use: Not Currently      Family Medical History:    None        Current Outpatient Medications   Medication Sig    levothyroxine (SYNTHROID) 150 mcg Oral Tablet Take 1 Tablet (150 mcg total) by mouth Every morning    pantoprazole (PROTONIX) 40 mg Oral Tablet, Delayed Release (E.C.) Take 1 Tablet (40 mg total) by mouth Once a day       No Known Allergies     Review of Systems:  Review of systems pertinent negatives and postives as discussed in HPI.      Objective:   Neurological Exam  Mental Status  Awake, alert and oriented to person, place and time. Oriented to person, place, time and situation. Recent and remote memory are intact. Speech is normal. Language is fluent with no aphasia. Attention and concentration are normal. Fund of knowledge is appropriate for level of education.    Cranial Nerves  CN I: Sense of smell is normal.  CN II: Visual acuity is normal. Visual fields full to confrontation.  CN III, IV, VI: Extraocular movements intact bilaterally. Normal lids and orbits bilaterally. Pupils equal round and reactive to light bilaterally.  CN V: Facial sensation is normal.  CN VII: Full and symmetric facial movement.  CN VIII: Hearing is normal.  CN IX, X: Palate elevates symmetrically. Normal gag reflex.  CN XI: Shoulder shrug strength is normal.  CN XII: Tongue midline without atrophy or fasciculations.    Motor  Normal muscle bulk throughout. Normal muscle tone. Strength is 5/5 throughout all four extremities.    Sensory  Sensation is intact to light  touch, pinprick, vibration and proprioception in all four extremities.  The patient has some numbness in the right 1st finger and thumb..    Reflexes                                            Right                      Left  Brachioradialis                    2+                         2+  Biceps                                 2+                         2+  Triceps                                2+                         2+  Finger flex                           2+                         2+  Hamstring                            2+                         2+  Patellar                                2+                         2+  Achilles                                2+                         2+    Right pathological reflexes: Hoffmann's absent.  Left pathological reflexes: Hoffmann's absent.    Coordination    Finger-to-nose, rapid alternating movements and heel-to-shin normal bilaterally without  dysmetria.    Gait  Normal casual, toe, heel and tandem gait.      Data reviewed:  Imaging:  I have reviewed the available radiographic studies including images and reports and made an independent interpretation of the findings and compared my findings to that of the interpreting radiologist.    MRI cervical spine done at [facility] [place] shows evidence of a large right C6-7 disc herniation causing severe foraminal compromise.  There was a smaller left-sided  C7-T1 disc herniation causing foraminal compromise at that level.  There was no high-grade stenosis of the central spinal canal.      Labs:     Assessment & Plan:       ICD-10-CM    1. Cervical radiculopathy  M54.12       Today we had a discussion about the patient's symptoms.  He does not have severe radicular symptoms at this time and he does not have evidence of any weakness on clinical examination that would be part of the radicular pattern.  He has a negative Spurling sign.  Secondary to the lack of severe symptoms and only some small numbness of the right thumb and 1st finger I would not recommend any surgical intervention even though there are large disc herniation seen on the MRI study.  He is doing well in his functioning well in his almost at his baseline.  I did educate him on worsening radicular symptoms as well as myelopathic type symptoms and he will call us if he has any worsening of these types his symptoms.  All questions were answered with the patient he agrees with this plan of care and we will see him back on an as-needed basis.    On the day of the encounter, a total of  40 minutes was spent on this patient encounter including review of historical information, examination, documentation and post-visit activities. The time documented excludes procedural time.   Otho Najjar, MD    This note was partially generated using MModal Fluency Direct system, and there may be some incorrect words, spellings, and punctuation that were  not noted in checking the note before saving.

## 2023-06-18 NOTE — Progress Notes (Signed)
Received a medical record request from Peri Jefferson, PA with [facility]  via fax. Faxed last office note as they were the referring providers office.

## 2023-08-18 ENCOUNTER — Other Ambulatory Visit (HOSPITAL_COMMUNITY): Payer: Self-pay | Admitting: PHYSICIAN ASSISTANT

## 2023-08-18 ENCOUNTER — Ambulatory Visit
Admission: RE | Admit: 2023-08-18 | Discharge: 2023-08-18 | Disposition: A | Discharge status: Still In Hospital | Payer: 59 | Source: Ambulatory Visit | Attending: PHYSICIAN ASSISTANT | Admitting: PHYSICIAN ASSISTANT

## 2023-08-18 ENCOUNTER — Other Ambulatory Visit: Payer: Self-pay

## 2023-08-18 DIAGNOSIS — M5010 Cervical disc disorder with radiculopathy, unspecified cervical region: Secondary | ICD-10-CM | POA: Insufficient documentation

## 2023-08-18 DIAGNOSIS — M5090 Cervical disc disorder, unspecified, unspecified cervical region: Secondary | ICD-10-CM | POA: Insufficient documentation

## 2023-08-18 NOTE — PT Evaluation (Signed)
[facility]  Outpatient Physical Therapy  [address]  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 Patient is left handed. Patient states he is noticing profound  weakness in B hands has been dropping things.       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 Prev was playing softball/basketball      Next MD visit: January     Pain location: Centralized neck pain                     Pain description: DULL and soreness, numbness/tingling into the right side     Pain frequency:  INTERMITTENT    Pain Rating:      Current Best Worst   EVAL 08/18/2023 1 0 7                          Radiculopathy: Into the right hand     Pain increases with:  quick head turns in either direction with a painful pop, heavy lifting           decreases with :  None      Sleep affected: Yes, pt uncertain if this is the neck or unrelated issue     Headaches: No    Dizziness: No      Patient-Specific Functional Score:    Problem  Score   1. Turning head  7   2. Bringing head back to wash hair 5   3. Lifting 10 lbs  8   Total score = sum of the activity scores/number of activities    Minimal detectable change (90% CI) for avg score = 2 points    Minimal detectable change (90% CI) for single activity score = 3 points  6.66         OBJECTIVE    ROM   right left   rotation 40, some tightness noted with this  54   sidebend 37 35   flexion 35 -------------------------   extension 30 -------------------------       Strength     right left   Shrug (C3-4) WFL down  WFL here down   Shoulder abduction (C5)     Elbow flexion (C6)     Elbow extension (C7)     Wrist extension (C6)     Wrist flexion (C7)     Thumb extension (C8)  Finger abduction (T1)     Neck lat. flexion [facility] WFL   Neck rotation [facility]   Neck flexion WFL ----------------------------------   Neck extension WLF ----------------------------------     Palpation: Non-tender to palpation throughout c/t spine    Posture: WNL    Grip strength  L hand elbow at 90: 80 lbs  R hand elbow at 90: 60 lbs         Special tests     ULTT: all neg B  Spurlings Test: [facility] B     Treatment provided:REVIEW OF POC AND GOALS WITH PATIENT, ALL QUESTIONS ANSWERED and PATIENT EDUCATION    Access Code: [email or web address]  URL: [email or web address]  Date: 08/18/2023  Prepared by: [name]    Exercises  - Cervical Extension AROM with Strap  - 2 x daily - 7 x weekly - 2 sets - 10 reps  - Supine Chin Tuck  - 2 x daily - 7 x weekly - 2 sets - 10 reps - 3 seconds  hold  - Standing Median Nerve Glide  - 2 x daily - 7 x weekly - 2 sets - 10 reps    ASSESSMENT    Impression: Cha Press is a 57 year old man presenting to outpatient PT secondary to cervical disc disorder. Pt reports recent MRI at outside medical provider indicative of disc herniation at C6-7 and C7-T1. He is a left handed individual who has been experiencing numbness into his right thumb, pointer, and middle finger. He states weakness here as  well, he has been having issues with dropping things. Patient has met with neurosurgery and had a surgery scheduled for this which was denied through insurance. He would benefit from outpatient PT to address to improve ROM, grip strength, and sensation and he may be an appropriate candidate for mechanical traction intervention as well. He has no concomitant comorbidities and his rehabilitation potential is good at this time.    Rehab potential: GOOD    Goals:    Short-Term Goals: (3 Weeks):     1.  Pt to be independent and consistent with HEP reporting exercise performance 1-2x daily to facilitate functional carryover of therapeutic gain to daily activity    2.  Pt to report pain at worst no greater than 4/10 to improve tolerance of object manipulation/handling  3.  Pt to demonstrate cervical extension to 40 deg or greater to improve ability to bring head back as in with hair washing ADLS    Long-Term Goals: (6 Weeks):     1.  Patient to improve PSFS to 8 or greater to reduce participation restriction secondary to this condition  2.  Patient to demonstrate symmetrical grip strength  3 Patient to demonstrate symmetrical and pain free cervical rotation        PLAN  Patient will attend 2 times per week x 6 weeks. Therapy may include, but is not limited to THERAPEUTIC EXERCISES, MYOFASCIAL/JOINT MOBILIZATION, POSTURE/BODY MECHANICS, ERGONOMIC TRAINING, TRANSFER/GAIT TRAINING, HOME INSTRUCTIONS, HEAT/COLD, ULTRASOUND, ELECTRICAL STIMULATION, KINESIOTAPE, and MECHANICAL TRACTION    Plan for next visit: Warm up UBE, review HEP, possibly trial mech. Cervical traction        Evaluation complexity:   Personal factors impacting POC:  None   Co-morbidities impacting POC:  None  Complexity of physical exam: INCLUDING MUSCULOSKELETAL SYSTEM (POSTURE, ROM, STRENGTH, HEIGHT/WEIGHT) and REFERRAL IS FOR A CHRONIC PROBLEM   Clinical Presentation: STABLE   Evaluation Complexity: LOW-HISTORY 0, EXAMINATION 1-2, STABLE  PRESENTATION  Total Session Time 45, Timed code minutes 0, and Untimed code minutes 45        Intervention minutes: EVALUATION 45 minutes    [name], PT  08/18/2023, 08:08                Certification:    From:______  Through:_________    I certify the need for these services furnished under this plan of treatment and while under my care.    Referring Provider Signature: _______________     Date : _____________________

## 2023-08-22 ENCOUNTER — Ambulatory Visit (HOSPITAL_COMMUNITY): Payer: Self-pay

## 2023-08-25 ENCOUNTER — Other Ambulatory Visit: Payer: Self-pay

## 2023-08-25 ENCOUNTER — Ambulatory Visit (HOSPITAL_COMMUNITY)
Admission: RE | Admit: 2023-08-25 | Discharge: 2023-08-25 | Disposition: A | Discharge status: Still In Hospital | Payer: 59 | Source: Ambulatory Visit | Attending: PHYSICIAN ASSISTANT | Admitting: PHYSICIAN ASSISTANT

## 2023-08-25 DIAGNOSIS — M5010 Cervical disc disorder with radiculopathy, unspecified cervical region: Secondary | ICD-10-CM | POA: Insufficient documentation

## 2023-08-25 NOTE — PT Treatment (Signed)
[facility]  Outpatient Physical Therapy  [address]  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 Patient reports still numbness into R hand     Objective: Therapy session performed per flowsheet, below    Measured ROM: NT today  EXERCISE/ACTIVITY NAME REPETITIONS RESISTANCE COMPLETED THIS DOS   UBE-FW only    4 min  Y   Cervical extension over towel   15  Y   Median nerve glide   20 ea  Y   Corner pec str   2x30 sec  Y   Open book str   15 ea   Y   Supine chin tuck   20  Y   Mechanical cervical traction   15 min 12 lbs static Y         - Cervical Extension AROM with Strap   - Supine Chin Tuck - 2 x daily -  - Standing Median Nerve Glide               Assessment: Pt states no effect traction on radicular sx some "increased blood flow", may benefit from increased weight/resistance next trial    Plan: Continue per POC    Total Session Time 40, Timed code minutes 40, and Untimed code minutes 0  THERAPEUTIC EXERCISE 25 minutes and MECHANICAL TRACTION 15 minutes      [name], PT  08/25/2023, 16:22

## 2023-08-29 ENCOUNTER — Ambulatory Visit (HOSPITAL_COMMUNITY)
Admission: RE | Admit: 2023-08-29 | Discharge: 2023-08-29 | Disposition: A | Discharge status: Still In Hospital | Payer: 59 | Source: Ambulatory Visit | Attending: PHYSICIAN ASSISTANT | Admitting: PHYSICIAN ASSISTANT

## 2023-08-29 NOTE — PT Treatment (Signed)
[facility] Medicine [facility]  Outpatient Physical Therapy  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 States he did have decrease tightness following the traction, but the numbness in his (R) hand has not changed.       Objective: Therapy session performed per flowsheet, below     Measured ROM: NT today  EXERCISE/ACTIVITY NAME REPETITIONS RESISTANCE COMPLETED THIS DOS   UBE-FW only     5 min   Y   Cervical extension over towel    15   Y   Median nerve glide    20 ea   Y   Corner pec str    2x30 sec   Y   Open book str    15 ea   Y   Supine chin tuck    20   Y   Mechanical cervical traction    15 min 14 lbs static Y             - Cervical Extension AROM with Strap   - Supine Chin Tuck - 2 x daily -  - Standing Median Nerve Glide                      Assessment: Tolerated increase from 12 pounds to 14 pounds with mechanical traction with good response. He reports his mobility improved especially with cervical extension.       Short-Term Goals: (3 Weeks):     1.  Pt to be independent and consistent with HEP reporting exercise performance 1-2x daily to facilitate functional carryover of therapeutic gain to daily activity    2.  Pt to report pain at worst no greater than 4/10 to improve tolerance of object manipulation/handling  3.  Pt to demonstrate cervical extension to 40 deg or greater to improve  ability to bring head back as in with hair washing ADLS    Long-Term Goals: (6 Weeks):     1.  Patient to improve PSFS to 8 or greater to reduce participation restriction secondary to this condition  2.  Patient to demonstrate symmetrical grip strength  3 Patient to demonstrate symmetrical and pain free cervical rotation      Plan: Continue per POC       Total Session Time 38, Timed code minutes 23, and Untimed code minutes 15  THERAPEUTIC EXERCISE 23 minutes and MECHANICAL TRACTION 15 minutes      [name], PTA  08/29/2023, 16:21

## 2023-09-01 ENCOUNTER — Other Ambulatory Visit: Payer: Self-pay

## 2023-09-01 ENCOUNTER — Ambulatory Visit (HOSPITAL_COMMUNITY)
Admission: RE | Admit: 2023-09-01 | Discharge: 2023-09-01 | Disposition: A | Discharge status: Still In Hospital | Payer: 59 | Source: Ambulatory Visit | Attending: PHYSICIAN ASSISTANT | Admitting: PHYSICIAN ASSISTANT

## 2023-09-01 NOTE — PT Treatment (Signed)
[facility] Medicine [facility]  Outpatient Physical Therapy  [address]  [place], [address]  [phone]  (Fax) [phone]    Physical Therapy Treatment Note    Date: 09/01/2023  Patient's Name: Jerry Quinn  Date of Birth: 08/14/1966  Physical Therapy Visit      Visit #/POC:4/12  Authorization:60 per cal year   POC Signed?: No  POC Ends: 12/9  Order Ends: 12/9  Next Progress Note Due: 11/25        Evaluating Physical Therapist: [name], DPT   PT diagnosis/Reason for Referral: Cervical Disc Disorder M50.1  Next Scheduled Physician Appointment: Neurosurg appointment in January for presurgical consult   Allergies/Contraindications: No              Subjective: No change in R hand numbness, has been consistent with exercises. Some soreness in back with increase in traction      Objective: Therapy session performed per flowsheet, below     Measured ROM: NT today  EXERCISE/ACTIVITY NAME REPETITIONS RESISTANCE COMPLETED THIS DOS   UBE-FW,BW    2/2   Y   Cervical extension over towel    15   Y   Median nerve glide    20 ea   Y   Corner pec str    2x30 sec   N   Open book str    15 ea    Y   Supine chin tuck    20   Y   Mechanical cervical traction    15 min 16 lbs static Y             - Cervical Extension AROM with Strap   - Supine Chin Tuck - 2 x daily -  - Standing Median Nerve Glide                      Assessment: Patient tolerated increase in traction resistance well today, he continues      Short-Term Goals: (3 Weeks):     1.  Pt to be independent and consistent with HEP reporting exercise performance 1-2x daily to facilitate functional carryover of therapeutic gain to daily activity    2.  Pt to report pain at worst no greater than 4/10 to improve tolerance of object manipulation/handling  3.  Pt to demonstrate cervical extension to 40 deg or greater to improve ability to bring head back as in with hair washing ADLS    Long-Term Goals: (6 Weeks):     1.  Patient to improve  PSFS to 8 or greater to reduce participation restriction secondary to this condition  2.  Patient to demonstrate symmetrical grip strength  3 Patient to demonstrate symmetrical and pain free cervical rotation          Plan: Continue per POC, continue w mechanical traction     Total Session Time 33, Timed code minutes 33, and Untimed code minutes 0  THERAPEUTIC EXERCISE 18 minutes and MECHANICAL TRACTION 15 minutes      [name], PT  09/01/2023, 07:40

## 2023-09-05 ENCOUNTER — Ambulatory Visit (HOSPITAL_COMMUNITY)
Admission: RE | Admit: 2023-09-05 | Discharge: 2023-09-05 | Disposition: A | Discharge status: Still In Hospital | Payer: 59 | Source: Ambulatory Visit | Attending: PHYSICIAN ASSISTANT | Admitting: PHYSICIAN ASSISTANT

## 2023-09-05 NOTE — PT Treatment (Signed)
[facility] Medicine [facility]  Outpatient Physical Therapy  [address]  [place], [address]  [phone]  (Fax) [phone]    Physical Therapy Treatment Note    Date: 09/05/2023  Patient's Name: Jerry Quinn  Date of Birth: [birth year]  Physical Therapy Visit      Visit #/POC:4/12  Authorization:60 per cal year   POC Signed?: No  POC Ends: 12/9  Order Ends: 12/9  Next Progress Note Due: 11/25        Evaluating Physical Therapist: [name], DPT   PT diagnosis/Reason for Referral: Cervical Disc Disorder M50.1  Next Scheduled Physician Appointment: Neurosurg appointment in January for presurgical consult   Allergies/Contraindications: No              Subjective: Numbness in middle finger (R) hand has increased and moved into the pad. States that overall he doesn't feel the traction has improved any symptoms in his hands.      Objective: Therapy session performed per flowsheet, below     Measured ROM: NT today  EXERCISE/ACTIVITY NAME REPETITIONS RESISTANCE COMPLETED THIS DOS   UBE-FW,BW    5 min alt fwd/backwards   Y   Cervical extension over towel    15   n   Median nerve glide    20 ea   Y   Corner pec str    2x30 sec   y   Open book str    15 ea    Y   Supine chin tuck    20   Y   Mechanical cervical traction    15 min 16 lbs static n    horizontal abd seated   Diagonals seated   x12 each  RTB  y   Wall snow  angel x10  y   Rows  x20  y   - Cervical Extension AROM with Strap   - Supine Chin Tuck - 2 x daily -  - Standing Median Nerve Glide                      Assessment: Patient reports no change in radicular symptoms with mechanical traction. He does report increase pain in 3rd digit. Tolerated progression of ther ex with no report of increase pain and numbness/tingling.     Short-Term Goals: (3 Weeks):     1.  Pt to be independent and consistent with HEP reporting exercise performance 1-2x daily to facilitate functional carryover of therapeutic gain to daily activity    2.   Pt to report pain at worst no greater than 4/10 to improve tolerance of object manipulation/handling  3.  Pt to demonstrate cervical extension to 40 deg or greater to improve ability to bring head back as in with hair washing ADLS    Long-Term Goals: (6 Weeks):     1.  Patient to improve PSFS to 8 or greater to reduce participation restriction secondary to this condition  2.  Patient to demonstrate symmetrical grip strength  3 Patient to demonstrate symmetrical and pain free cervical rotation           Plan: Continue per POC, continue w mechanical traction if applicable.           Total Session Time 30 and Timed code minutes 30  THERAPEUTIC EXERCISE 30 minutes      [name], PTA  09/05/2023, 17:02

## 2023-09-08 ENCOUNTER — Other Ambulatory Visit: Payer: Self-pay

## 2023-09-08 ENCOUNTER — Ambulatory Visit (HOSPITAL_COMMUNITY)
Admission: RE | Admit: 2023-09-08 | Discharge: 2023-09-08 | Disposition: A | Discharge status: Still In Hospital | Payer: 59 | Source: Ambulatory Visit | Attending: PHYSICIAN ASSISTANT | Admitting: PHYSICIAN ASSISTANT

## 2023-09-08 NOTE — PT Treatment (Signed)
[facility] Medicine [facility]  Outpatient Physical Therapy  [address]  [place], [address]  [phone]  (Fax) [phone]    Physical Therapy Treatment Note    Date: 09/08/2023  Patient's Name: Jerry Quinn  Date of Birth: [birth year]  Physical Therapy Visit            Visit #/POC: 5/12  Authorization:60 per cal year   POC Signed?: No  POC Ends: 12/9  Order Ends: 12/9  Next Progress Note Due: 11/25        Evaluating Physical Therapist: [name], DPT transferring care to Doy Mince PT, DPT 09/08/23  PT diagnosis/Reason for Referral: Cervical Disc Disorder M50.1  Next Scheduled Physician Appointment: Neurosurg appointment in January for presurgical consult   Allergies/Contraindications: No              Subjective: Patient notes no change to numbness in right hand. Patient notes no difference in symptoms with holding traction.      Objective: Therapy session performed per flowsheet, below     Measured ROM: NT today  EXERCISE/ACTIVITY NAME REPETITIONS RESISTANCE COMPLETED THIS DOS   UBE-FW,BW    5 min alt fwd/backwards   Y   Cervical extension over towel    15   n   Median nerve glide    20xea   Y   Corner pec str    2x30 sec   Y   Open book str    15 ea    Y   Supine chin tuck    2x10   Y   Mechanical cervical traction    15 min 16 lbs static n    horizontal abd seated   Diagonals seated  2x10   2x10  Red   Red  Y   Wall snow  angel 2x10   Y   Rows  2x10  Green Y   - Cervical Extension AROM with Strap   - Supine Chin Tuck - 2 x daily -  - Standing Median Nerve Glide                      Assessment: Patient demos good tolerance to exercise program this date. Mechanical traction d/c due to no relief of symptoms. Patient denies increased N/T or pain throughout exercise program. Will continue to monitor patient response and progress as able and tolerated.      Short-Term Goals: (3 Weeks):     1.  Pt to be independent and consistent with HEP reporting exercise performance 1-2x daily  to facilitate functional carryover of therapeutic gain to daily activity    2.  Pt to report pain at worst no greater than 4/10 to improve tolerance of object manipulation/handling  3.  Pt to demonstrate cervical extension to 40 deg or greater to improve ability to bring head back as in with hair washing ADLS    Long-Term Goals: (6 Weeks):     1.  Patient to improve PSFS to 8 or greater to reduce participation restriction secondary to this condition  2.  Patient to demonstrate symmetrical grip strength  3 Patient to demonstrate symmetrical and pain free cervical rotation           Plan: Continue per POC, continue w mechanical traction if applicable.  Total Session Time 40 and Timed code minutes 40  THERAPEUTIC EXERCISE 40 minutes      Doy Mince, PT  09/08/2023, 17:02

## 2023-09-12 ENCOUNTER — Ambulatory Visit (HOSPITAL_COMMUNITY)
Admission: RE | Admit: 2023-09-12 | Discharge: 2023-09-12 | Disposition: A | Discharge status: Still In Hospital | Payer: 59 | Source: Ambulatory Visit | Attending: PHYSICIAN ASSISTANT | Admitting: PHYSICIAN ASSISTANT

## 2023-09-12 NOTE — PT Treatment (Signed)
[facility] Medicine [facility]  Outpatient Physical Therapy  [address]  [place], [address]  [phone]  (Fax) [phone]    Physical Therapy Treatment Note    Date: 09/12/2023  Patient's Name: Jerry Quinn  Date of Birth: Oct 01, 1966  Physical Therapy Visit      Visit #/POC: 7/12  Authorization:60 per cal year   POC Signed?: No  POC Ends: 12/9  Order Ends: 12/9  Next Progress Note Due: 11/25        Evaluating Physical Therapist: [name], DPT transferring care to Doy Mince PT, DPT 09/08/23  PT diagnosis/Reason for Referral: Cervical Disc Disorder M50.1  Next Scheduled Physician Appointment: Neurosurg appointment in January for presurgical consult   Allergies/Contraindications: No            Subjective:  Pt reports no changes.  States numbness remains the same.  States he is doing exercises at home.  Notes no changes.      Objective:  Warm up on UBE followed by activities as noted below.      Measured ROM:     EXERCISE/ACTIVITY NAME REPETITIONS RESISTANCE COMPLETED THIS DOS   UBE-FW,BW    6 min alt fwd/backwards   Y   Cervical extension over towel    15   n   Median nerve glide    20xea   Y   Corner pec str    2x30 sec   Y   Open book str    15 ea    Y   Supine chin tuck    2x10   Y   Mechanical cervical traction    15 min 16 lbs static n    horizontal abd seated   Diagonals seated  2x10   2x10  Red   Red  Y   Wall snow  angel 2x10   Y   Rows   Low row 2x10  2 x 10  Green  green Y  y   - Cervical Extension AROM with Strap   - Supine Chin Tuck - 2 x daily -  - Standing Median Nerve Glide                  Assessment: Pt tolerates strengthening well.  He reports no improvement in symptoms or mobility but no complaints with strengthening     Short-Term Goals: (3 Weeks):     1.  Pt to be independent and consistent with HEP reporting exercise performance 1-2x daily to facilitate functional carryover of therapeutic gain to daily activity    2.  Pt to report pain at worst no  greater than 4/10 to improve tolerance of object manipulation/handling  3.  Pt to demonstrate cervical extension to 40 deg or greater to improve ability to bring head back as in with hair washing ADLS    Long-Term Goals: (6 Weeks):     1.  Patient to improve PSFS to 8 or greater to reduce participation restriction secondary to this condition  2.  Patient to demonstrate symmetrical grip strength  3 Patient to demonstrate symmetrical and pain free cervical rotation           Plan:  Will continue and progress as tolerated     Total Session Time 30 and Timed code minutes 30  THERAPEUTIC EXERCISE 30 minutes      Brinsley Wence, PTA  09/12/2023, 17:03

## 2023-09-15 ENCOUNTER — Other Ambulatory Visit: Payer: Self-pay

## 2023-09-15 ENCOUNTER — Ambulatory Visit (HOSPITAL_COMMUNITY)
Admission: RE | Admit: 2023-09-15 | Discharge: 2023-09-15 | Disposition: A | Discharge status: Still In Hospital | Payer: 59 | Source: Ambulatory Visit | Attending: PHYSICIAN ASSISTANT | Admitting: PHYSICIAN ASSISTANT

## 2023-09-15 NOTE — PT Treatment (Signed)
[facility] Medicine [facility]  Outpatient Physical Therapy  [address]  [place], [address]  [phone]  (Fax) [phone]    Physical Therapy Progress Note    Date: 09/15/2023  Patient's Name: Jerry Quinn  Date of Birth: June 02, 1966  Physical Therapy Progress Note           isit #/POC: 8/12  Authorization:60 per cal year   POC Signed?: No  POC Ends: 12/9  Order Ends: 12/9  Next Progress Note Due: On or before 09/29/23        Evaluating Physical Therapist: [name], DPT transferring care to Doy Mince PT, DPT 09/08/23  PT diagnosis/Reason for Referral: Cervical Disc Disorder M50.1  Next Scheduled Physician Appointment: Neurosurg appointment in January for presurgical consult   Allergies/Contraindications: No                 Subjective:  Patient reports no change to numbness in his hand at this time.     Objective:  Warm up on UBE followed by activities as noted below.       Measured ROM:       Pain Rating:        Current Best Worst   EVAL 08/18/2023 1 0 7    09/15/23  0  0  8                             Patient-Specific Functional Score:     Problem Score IE 08/18/23 09/15/23   1. Turning head  7 8   2. Bringing head back to wash hair 5 4   3. Lifting 10 lbs  8 9   Total score = sum of the activity scores/number of activities    Minimal detectable change (90% CI) for avg score = 2 points    Minimal detectable change (90% CI) for single activity score = 3 points  6.66 7                                                          OBJECTIVE     ROM    Right IE Left IE Right 09/15/23 Left 09/15/23   rotation 40, some tightness noted with this  54 45 60   sidebend 37 35 40 35   flexion 35 ------------------------- 55 ------------------   extension 30 ------------------------ 45 -------------------     Grip strength   L hand elbow at 90: 110 lbs   R hand elbow at 90: 75 lbs        EXERCISE/ACTIVITY NAME REPETITIONS RESISTANCE COMPLETED THIS DOS   UBE-FW,BW    6 min alt fwd/backwards    Y   Cervical extension over towel    15   n   Median nerve glide    20xea   Y   Corner pec str    2x30 sec   Y   Open book str    15 ea    Y   Supine chin tuck    2x10   Y   Mechanical cervical traction    15 min 16 lbs static n    horizontal abd seated   Diagonals seated  2x10   2x10 Red  Red Y  Y   Wall snow  angel 2x10   Y   Rows   Low row 2x10  2x10 Riki Sheer Y  y   - Cervical Extension AROM with Strap   - Supine Chin Tuck - 2 x daily -  - Standing Median Nerve Glide                   Assessment: Patient has attended 8 visits of OPPT with good adherence to POC and HEP. Patient demos improved cervical AROM and improved grip strength this date and has met established short term goals. However objective improvements have not correlated with reduction of subjective complaints or patient perceived function. Patient continues to note right hand numbness which causes him to drop objects and pain with cervical rotation accompanied by a "catch." Will continue for additional 4 visits to complete POC. Will reassess at that time.     Short-Term Goals: (3 Weeks):     1.  Pt to be independent and consistent with HEP reporting exercise performance 1-2x daily to facilitate functional carryover of therapeutic gain to daily activity  ( MET 09/15/23 )  2.  Pt to report pain at worst no greater than 4/10 to improve tolerance of object manipulation/handling ( MET 09/15/23 )   3.  Pt to demonstrate cervical extension to 40 deg or greater to improve ability to bring head back as in with hair washing ADLS ( MET 09/15/23 )    Long-Term Goals: (6 Weeks):     1.  Patient to improve PSFS to 8 or greater to reduce participation restriction secondary to this condition ( Not MET 09/15/23 )   2.  Patient to demonstrate symmetrical grip strength ( Not Met 09/15/23 )   3 Patient to demonstrate symmetrical and pain free cervical rotation ( Not Met 09/15/23 )               Plan:  Will continue and progress as tolerated     Total Session Time 40  and Timed code minutes 40  EVALUATION 40 minutes      Doy Mince, PT  09/15/2023, 17:01

## 2023-09-19 ENCOUNTER — Ambulatory Visit
Admission: RE | Admit: 2023-09-19 | Discharge: 2023-09-19 | Disposition: A | Discharge status: Still In Hospital | Payer: 59 | Source: Ambulatory Visit | Attending: PHYSICIAN ASSISTANT | Admitting: PHYSICIAN ASSISTANT

## 2023-09-19 NOTE — PT Treatment (Signed)
[facility] Medicine [facility]  Outpatient Physical Therapy  [address]  [place], [address]  [phone]  (Fax) [phone]    Physical Therapy Treatment Note    Date: 09/19/2023  Patient's Name: Jerry Quinn  Date of Birth: 1966/08/26  Physical Therapy Visit      Visit #/POC: 9/12  Authorization:60 per cal year   POC Signed?: No  POC Ends: 12/9  Order Ends: 12/9  Next Progress Note Due: On or before 09/29/23        Evaluating Physical Therapist: [name], DPT transferring care to Doy Mince PT, DPT 09/08/23  PT diagnosis/Reason for Referral: Cervical Disc Disorder M50.1  Next Scheduled Physician Appointment: Neurosurg appointment in January for presurgical consult   Allergies/Contraindications: No          Subjective:  Pt reports he feels about the same.  States he is doing exercises at [employer].  Still dropping stuff.      Objective:  Activities as noted below.  No additions to HEP today.     Measured ROM:        EXERCISE/ACTIVITY NAME REPETITIONS RESISTANCE COMPLETED THIS DOS   UBE-FW,BW    6 min alt fwd/backwards   Y   Cervical extension over towel    15   n   Median nerve glide    20xea   Y   Corner pec str    2x30 sec   Y   Open book str    15 ea    Y   Supine chin tuck    2x10   n   Mechanical cervical traction    15 min 16 lbs static n    horizontal abd seated   Diagonals seated  2x10   2x10 Green  Green  Y  Y   Wall snow  angel 2x10   Y   Rows   Low row 2x10  2x10 Riki Sheer Y  y   - Cervical Extension AROM with Strap   - Supine Chin Tuck - 2 x daily -  - Standing Median Nerve Glide                  Assessment:   Pt tolerated treatment well.  He does continue with same subjective complaints.  Reassessment from last visit does note improvement with objective measures.      Short-Term Goals: (3 Weeks):     1.  Pt to be independent and consistent with HEP reporting exercise performance 1-2x daily to facilitate functional carryover of therapeutic gain to daily activity   ( MET 09/15/23 )  2.  Pt to report pain at worst no greater than 4/10 to improve tolerance of object manipulation/handling ( MET 09/15/23 )   3.  Pt to demonstrate cervical extension to 40 deg or greater to improve ability to bring head back as in with hair washing ADLS ( MET 09/15/23 )    Long-Term Goals: (6 Weeks):     1.  Patient to improve PSFS to 8 or greater to reduce participation restriction secondary to this condition ( Not MET 09/15/23 )   2.  Patient to demonstrate symmetrical grip strength ( Not Met 09/15/23 )   3 Patient to demonstrate symmetrical and pain free cervical rotation ( Not Met 09/15/23 )             Plan:  will continue with 3 more visits on POC     Total Session Time 35 and Timed  code minutes 35  THERAPEUTIC EXERCISE 35 minutes      Marlisa Caridi, PTA  09/19/2023, 16:17

## 2023-09-22 ENCOUNTER — Ambulatory Visit (HOSPITAL_COMMUNITY)
Admission: RE | Admit: 2023-09-22 | Discharge: 2023-09-22 | Disposition: A | Discharge status: Still In Hospital | Payer: 59 | Source: Ambulatory Visit | Attending: PHYSICIAN ASSISTANT | Admitting: PHYSICIAN ASSISTANT

## 2023-09-22 ENCOUNTER — Other Ambulatory Visit: Payer: Self-pay

## 2023-09-22 DIAGNOSIS — M5010 Cervical disc disorder with radiculopathy, unspecified cervical region: Secondary | ICD-10-CM | POA: Insufficient documentation

## 2023-09-22 NOTE — PT Treatment (Signed)
[facility] Medicine [facility]  Outpatient Physical Therapy  [address]  [place], [address]  [phone]  (Fax) [phone]    Physical Therapy Treatment Note    Date: 09/22/2023  Patient's Name: Jerry Quinn  Date of Birth: [birth year]  Physical Therapy Visit      Visit #/POC: 10/12  Authorization:60 per cal year   POC Signed?: No  POC Ends: 12/9  Order Ends: 12/9  Next Progress Note Due: On or before 09/29/23        Evaluating Physical Therapist: [name], DPT transferring care to Doy Mince PT, DPT 09/08/23  PT diagnosis/Reason for Referral: Cervical Disc Disorder M50.1  Next Scheduled Physician Appointment: Neurosurg appointment in January for presurgical consult   Allergies/Contraindications: No              Subjective:  Patient reports increased pain while he was working but notes symptoms have subsided at this point. Patient notes increased L ring finger numbness.      Objective:  Activities as noted below.  No additions to HEP today.      Measured ROM:          EXERCISE/ACTIVITY NAME REPETITIONS RESISTANCE COMPLETED THIS DOS   UBE-FW,BW    6 min alt fwd/backwards   Y   Cervical extension over towel    15   n   Median nerve glide    20xea   Y   Corner pec str    2x30 sec   Y   Open book str    15 eax   Y   Supine chin tuck    2x10   n   Mechanical cervical traction    15 min 16 lbs static n    horizontal abd seated   Diagonals seated  2x10   2x10 Blue  Blue Y  Y   Wall snow  angel 2x10   Y   Rows   Low row 2x10  2x10 Blue  Blue Y  Y   - Cervical Extension AROM with Strap   - Supine Chin Tuck - 2 x daily -  - Standing Median Nerve Glide                   Assessment:   Patient demos good tolerance to increased resistance this date. Patient continues to note limited improvement to subjective complaint. Will continue for 3 additional visits.     Short-Term Goals: (3 Weeks):     1.  Pt to be independent and consistent with HEP reporting exercise performance 1-2x daily to  facilitate functional carryover of therapeutic gain to daily activity  ( MET 09/15/23 )  2.  Pt to report pain at worst no greater than 4/10 to improve tolerance of object manipulation/handling ( MET 09/15/23 )   3.  Pt to demonstrate cervical extension to 40 deg or greater to improve ability to bring head back as in with hair washing ADLS ( MET 09/15/23 )    Long-Term Goals: (6 Weeks):     1.  Patient to improve PSFS to 8 or greater to reduce participation restriction secondary to this condition ( Not MET 09/15/23 )   2.  Patient to demonstrate symmetrical grip strength ( Not Met 09/15/23 )   3 Patient to demonstrate symmetrical and pain free cervical rotation ( Not Met 09/15/23 )               Plan:  Plan to D/C next Monday.  Total Session Time 40 and Timed code minutes 40  THERAPEUTIC EXERCISE 40 minutes      Doy Mince, PT  09/22/2023, 17:07

## 2023-09-26 ENCOUNTER — Ambulatory Visit (HOSPITAL_COMMUNITY)
Admission: RE | Admit: 2023-09-26 | Discharge: 2023-09-26 | Disposition: A | Discharge status: Still In Hospital | Payer: 59 | Source: Ambulatory Visit | Attending: PHYSICIAN ASSISTANT | Admitting: PHYSICIAN ASSISTANT

## 2023-09-26 NOTE — PT Treatment (Signed)
[facility] Medicine [facility]  Outpatient Physical Therapy  [address]  [place], [address]  [phone]  (Fax) [phone]    Physical Therapy Treatment Note    Date: 09/26/2023  Patient's Name: Jerry Quinn  Date of Birth: 12/03/1965  Physical Therapy Visit    Visit #/POC: 11/12  Authorization:60 per cal year   POC Signed?: No  POC Ends: 12/9  Order Ends: 12/9  Next Progress Note Due: On or before 09/29/23        Evaluating Physical Therapist: [name], DPT transferring care to Doy Mince PT, DPT 09/08/23  PT diagnosis/Reason for Referral: Cervical Disc Disorder M50.1  Next Scheduled Physician Appointment: Neurosurg appointment in January for presurgical consult   Allergies/Contraindications: No            Subjective: Pt reports no real changes.  Notes he does have some numbness on medial portion of 4th digit now.  Pt states this has been present for about a week.  Pt notes he is doing some exercise at home.      Objective:  UBE  followed by activities as noted below.        EXERCISE/ACTIVITY NAME REPETITIONS RESISTANCE COMPLETED THIS DOS   UBE-FW,BW    6 min alt fwd/backwards   Y   Cervical extension over towel    15   n   Median nerve glide    20xea   Y   Corner pec str    2x30 sec   Y   Open book str    15 eax   Y   Supine chin tuck    2x10   n   Mechanical cervical traction    15 min 16 lbs static n    horizontal abd   Diagonals  2x10   2x10 Blue  Blue Y  Y   Wall snow  angel 2x10   Y   Rows   Low row 2x10  2x10 Blue  Blue Y  Y   - Cervical Extension AROM with Strap   - Supine Chin Tuck - 2 x daily -  - Standing Median Nerve Glide                  Assessment: Pt exhibits improved mobility and endurance with active exercises.     Short-Term Goals: (3 Weeks):     1.  Pt to be independent and consistent with HEP reporting exercise performance 1-2x daily to facilitate functional carryover of therapeutic gain to daily activity  ( MET 09/15/23 )  2.  Pt to report pain at worst  no greater than 4/10 to improve tolerance of object manipulation/handling ( MET 09/15/23 )   3.  Pt to demonstrate cervical extension to 40 deg or greater to improve ability to bring head back as in with hair washing ADLS ( MET 09/15/23 )    Long-Term Goals: (6 Weeks):     1.  Patient to improve PSFS to 8 or greater to reduce participation restriction secondary to this condition ( Not MET 09/15/23 )   2.  Patient to demonstrate symmetrical grip strength ( Not Met 09/15/23 )   3 Patient to demonstrate symmetrical and pain free cervical rotation ( Not Met 09/15/23 )            Plan:  1 more visit.  PT to reassess    Total Session Time 30 and Timed code minutes 30  THERAPEUTIC EXERCISE 30 minutes  Sible Straley, PTA  09/26/2023, 16:17

## 2023-09-29 ENCOUNTER — Ambulatory Visit
Admission: RE | Admit: 2023-09-29 | Discharge: 2023-09-29 | Disposition: A | Discharge status: Still In Hospital | Payer: 59 | Source: Ambulatory Visit | Attending: PHYSICIAN ASSISTANT | Admitting: PHYSICIAN ASSISTANT

## 2023-09-29 ENCOUNTER — Other Ambulatory Visit: Payer: Self-pay

## 2023-09-29 NOTE — PT Treatment (Signed)
[facility] Medicine [facility]  Outpatient Physical Therapy  [address]  [place], [address]  [phone]  (Fax) [phone]    Physical Therapy Discharge Note    Date: 09/29/2023  Patient's Name: Jerry Quinn  Date of Birth: 1966-02-01  Physical Therapy Discharge      Visit #/POC: 12/12  Authorization:60 per cal year   POC Signed?: No  POC Ends: 12/9  Order Ends: 12/9  Next Progress Note Due: On or before 09/29/23        Evaluating Physical Therapist: [name], DPT transferring care to Doy Mince PT, DPT 09/08/23  PT diagnosis/Reason for Referral: Cervical Disc Disorder M50.1  Next Scheduled Physician Appointment: Neurosurg appointment in January for presurgical consult   Allergies/Contraindications: No                 Subjective:  Patient continues to note R hand N/T. Patient notes no change from previous session. Patient is agreeable to D/C at this time.      Objective:  Warm up on UBE followed by activities as noted below.       Measured ROM:         Pain Rating:        Current Best Worst   EVAL 08/18/2023 1 0 7    09/15/23 0 0 8    09/29/23 0 0 6-7         Patient-Specific Functional Score:     Problem Score IE 08/18/23 09/15/23 09/29/23   1. Turning head  7 8 8    2. Bringing head back to wash hair 5 4 7    3. Lifting 10 lbs  8 9 9    Total score = sum of the activity scores/number of activities    Minimal detectable change (90% CI) for avg score = 2 points    Minimal detectable change (90% CI) for single activity score = 3 points  6.66 7 8                                                          OBJECTIVE     ROM    Right IE Left IE Right 09/15/23 Left 09/15/23   rotation 40, some tightness noted with this  54 45 60   sidebend 37 35 40 35   flexion 35 ------------------------- 55 ------------------   extension 30 ------------------------ 45 -------------------      Grip strength   L hand elbow at 90: 110 lbs   R hand elbow at 90: 75 lbs         EXERCISE/ACTIVITY NAME  REPETITIONS RESISTANCE COMPLETED THIS DOS   UBE-FW,BW    6 min alt fwd/backwards   Y   Cervical extension over towel    15   n   Median nerve glide    20xea   Y   Corner pec str    2x30 sec   Y   Open book str    15 ea    Y   Supine chin tuck    2x10   Y   Mechanical cervical traction    15 min 16 lbs static n    horizontal abd seated   Diagonals seated  2x10   2x10 Red  Red Y  Y   Wall snow  angel  2x10   Y   Rows   Low row 2x10  2x10 Blue  Blue Y  y   - Cervical Extension AROM with Strap   - Supine Chin Tuck - 2 x daily -  - Standing Median Nerve Glide                   Assessment: Patient has attended 12 visits of OPPT with good adherence to POC and HEP. Patient demos improved cervical AROM and improved grip strength this date and has met established short term goals. However, objective improvements have not correlated with reduction of subjective complaints or persistent R hand N/T. Patient continues to note worsening right hand numbness which causes him to drop objects and pain with cervical rotation accompanied by a "catch." Will D/C patient from formal PT at this time. Recommend further medical management of symptoms due to limited improvement to cervical pain and worsening R hand N/T.      Short-Term Goals: (3 Weeks):     1.  Pt to be independent and consistent with HEP reporting exercise performance 1-2x daily to facilitate functional carryover of therapeutic gain to daily activity  ( MET 09/15/23 )  2.  Pt to report pain at worst no greater than 4/10 to improve tolerance of object manipulation/handling ( MET 09/15/23 )   3.  Pt to demonstrate cervical extension to 40 deg or greater to improve ability to bring head back as in with hair washing ADLS ( MET 09/15/23 )    Long-Term Goals: (6 Weeks):     1.  Patient to improve PSFS to 8 or greater to reduce participation restriction secondary to this condition ( Not MET 09/15/23 )   2.  Patient to demonstrate symmetrical grip strength ( Not Met 09/15/23 )   3  Patient to demonstrate symmetrical and pain free cervical rotation ( Not Met 09/15/23 )               Plan:  Will D/C and refer patient back to PCP due to limited improvement of pain and worsening N/T.      Total Session Time 43 and Timed code minutes 43  THERAPEUTIC EXERCISE 43 minutes      Doy Mince, PT  09/29/2023, 17:04

## 2024-06-29 ENCOUNTER — Other Ambulatory Visit: Payer: Self-pay

## 2024-06-29 ENCOUNTER — Ambulatory Visit
Admission: RE | Admit: 2024-06-29 | Discharge: 2024-06-29 | Disposition: A | Discharge status: Home / Self Care | Source: Ambulatory Visit

## 2024-06-29 DIAGNOSIS — Z981 Arthrodesis status: Secondary | ICD-10-CM

## 2024-06-29 DIAGNOSIS — M5030 Other cervical disc degeneration, unspecified cervical region: Secondary | ICD-10-CM | POA: Insufficient documentation

## 2024-06-29 DIAGNOSIS — M542 Cervicalgia: Secondary | ICD-10-CM
